# Patient Record
Sex: Female | Born: 1980 | Smoking: Never smoker
Health system: Southern US, Community
[De-identification: ages and names within clinical notes are randomized; demographics above are authoritative.]

## PROBLEM LIST (undated history)

## (undated) ENCOUNTER — Inpatient Hospital Stay (HOSPITAL_COMMUNITY): Payer: Self-pay

## (undated) DIAGNOSIS — F419 Anxiety disorder, unspecified: Secondary | ICD-10-CM

## (undated) DIAGNOSIS — O24419 Gestational diabetes mellitus in pregnancy, unspecified control: Secondary | ICD-10-CM

## (undated) DIAGNOSIS — F32A Depression, unspecified: Secondary | ICD-10-CM

## (undated) DIAGNOSIS — K8020 Calculus of gallbladder without cholecystitis without obstruction: Secondary | ICD-10-CM

## (undated) DIAGNOSIS — K859 Acute pancreatitis without necrosis or infection, unspecified: Secondary | ICD-10-CM

## (undated) DIAGNOSIS — F329 Major depressive disorder, single episode, unspecified: Secondary | ICD-10-CM

## (undated) HISTORY — PX: NO PAST SURGERIES: SHX2092

## (undated) HISTORY — DX: Gestational diabetes mellitus in pregnancy, unspecified control: O24.419

---

## 2011-06-08 ENCOUNTER — Other Ambulatory Visit (HOSPITAL_COMMUNITY)
Admission: RE | Admit: 2011-06-08 | Discharge: 2011-06-08 | Disposition: A | Discharge status: Home / Self Care | Payer: No Typology Code available for payment source | Source: Ambulatory Visit | Attending: Obstetrics and Gynecology | Admitting: Obstetrics and Gynecology

## 2011-06-08 DIAGNOSIS — Z113 Encounter for screening for infections with a predominantly sexual mode of transmission: Secondary | ICD-10-CM | POA: Insufficient documentation

## 2011-06-08 DIAGNOSIS — Z124 Encounter for screening for malignant neoplasm of cervix: Secondary | ICD-10-CM | POA: Insufficient documentation

## 2011-06-08 LAB — OB RESULTS CONSOLE GC/CHLAMYDIA
Chlamydia: NEGATIVE
Gonorrhea: NEGATIVE

## 2011-06-10 DIAGNOSIS — K859 Acute pancreatitis without necrosis or infection, unspecified: Secondary | ICD-10-CM

## 2011-06-10 DIAGNOSIS — K8020 Calculus of gallbladder without cholecystitis without obstruction: Secondary | ICD-10-CM

## 2011-06-10 HISTORY — DX: Acute pancreatitis without necrosis or infection, unspecified: K85.90

## 2011-06-10 HISTORY — DX: Calculus of gallbladder without cholecystitis without obstruction: K80.20

## 2011-07-01 ENCOUNTER — Inpatient Hospital Stay (HOSPITAL_COMMUNITY)
Admission: AD | Admit: 2011-07-01 | Discharge: 2011-07-10 | DRG: 781 | Disposition: A | Discharge status: Home / Self Care | Payer: No Typology Code available for payment source | Source: Ambulatory Visit | Attending: Obstetrics and Gynecology | Admitting: Obstetrics and Gynecology

## 2011-07-01 ENCOUNTER — Observation Stay (HOSPITAL_COMMUNITY): Payer: No Typology Code available for payment source

## 2011-07-01 ENCOUNTER — Encounter (HOSPITAL_COMMUNITY): Payer: Self-pay | Admitting: *Deleted

## 2011-07-01 DIAGNOSIS — O9989 Other specified diseases and conditions complicating pregnancy, childbirth and the puerperium: Principal | ICD-10-CM | POA: Diagnosis present

## 2011-07-01 DIAGNOSIS — K831 Obstruction of bile duct: Secondary | ICD-10-CM

## 2011-07-01 DIAGNOSIS — K8020 Calculus of gallbladder without cholecystitis without obstruction: Secondary | ICD-10-CM | POA: Diagnosis present

## 2011-07-01 DIAGNOSIS — K859 Acute pancreatitis without necrosis or infection, unspecified: Secondary | ICD-10-CM | POA: Diagnosis present

## 2011-07-01 DIAGNOSIS — O210 Mild hyperemesis gravidarum: Secondary | ICD-10-CM | POA: Diagnosis present

## 2011-07-01 DIAGNOSIS — R109 Unspecified abdominal pain: Secondary | ICD-10-CM | POA: Diagnosis present

## 2011-07-01 HISTORY — DX: Anxiety disorder, unspecified: F41.9

## 2011-07-01 LAB — CBC
Hemoglobin: 11.4 g/dL — ABNORMAL LOW (ref 12.0–15.0)
MCH: 28.6 pg (ref 26.0–34.0)
MCV: 83.5 fL (ref 78.0–100.0)
Platelets: 152 10*3/uL (ref 150–400)
RBC: 3.99 MIL/uL (ref 3.87–5.11)
WBC: 9.5 10*3/uL (ref 4.0–10.5)

## 2011-07-01 LAB — HCG, QUANTITATIVE, PREGNANCY: hCG, Beta Chain, Quant, S: 86275 m[IU]/mL — ABNORMAL HIGH (ref <5)

## 2011-07-01 LAB — URINALYSIS, ROUTINE W REFLEX MICROSCOPIC
Glucose, UA: NEGATIVE mg/dL
Hgb urine dipstick: NEGATIVE
Protein, ur: NEGATIVE mg/dL
Specific Gravity, Urine: 1.01 (ref 1.005–1.030)
pH: 6 (ref 5.0–8.0)

## 2011-07-01 LAB — DIFFERENTIAL
Lymphs Abs: 1.1 10*3/uL (ref 0.7–4.0)
Monocytes Relative: 5 % (ref 3–12)
Neutro Abs: 7.9 10*3/uL — ABNORMAL HIGH (ref 1.7–7.7)
Neutrophils Relative %: 83 % — ABNORMAL HIGH (ref 43–77)

## 2011-07-01 LAB — LIPASE, BLOOD: Lipase: 105 U/L — ABNORMAL HIGH (ref 11–59)

## 2011-07-01 LAB — COMPREHENSIVE METABOLIC PANEL
AST: 19 U/L (ref 0–37)
Albumin: 3.6 g/dL (ref 3.5–5.2)
BUN: 6 mg/dL (ref 6–23)
Calcium: 9.1 mg/dL (ref 8.4–10.5)
Creatinine, Ser: 0.42 mg/dL — ABNORMAL LOW (ref 0.50–1.10)
Total Protein: 6.2 g/dL (ref 6.0–8.3)

## 2011-07-01 LAB — AMYLASE: Amylase: 151 U/L — ABNORMAL HIGH (ref 0–105)

## 2011-07-01 MED ORDER — ZOLPIDEM TARTRATE 5 MG PO TABS: 5.0000 mg | ORAL_TABLET | Freq: Every evening | ORAL | Status: DC | PRN

## 2011-07-01 MED ORDER — SODIUM CHLORIDE 0.9 % IV SOLN
1000.0000 mL | Freq: Once | INTRAVENOUS | Status: AC
  Administered 2011-07-01: 1000 mL via INTRAVENOUS

## 2011-07-01 MED ORDER — PANTOPRAZOLE SODIUM 40 MG IV SOLR
40.0000 mg | INTRAVENOUS | Status: DC
  Administered 2011-07-01 – 2011-07-10 (×9): 40 mg via INTRAVENOUS
  Filled 2011-07-01 (×9): qty 40

## 2011-07-01 MED ORDER — SODIUM CHLORIDE 0.9 % IV SOLN
1000.0000 mL | INTRAVENOUS | Status: DC
  Administered 2011-07-01 – 2011-07-02 (×2): 1000 mL via INTRAVENOUS

## 2011-07-01 MED ORDER — SODIUM CHLORIDE 0.9 % IV SOLN: 1000.0000 mL | Freq: Once | INTRAVENOUS | Status: DC

## 2011-07-01 MED ORDER — M.V.I. ADULT IV INJ
INJECTION | Freq: Once | INTRAVENOUS | Status: AC
  Administered 2011-07-01: 20:00:00 via INTRAVENOUS
  Filled 2011-07-01: qty 1000

## 2011-07-01 MED ORDER — ACETAMINOPHEN 650 MG RE SUPP
650.0000 mg | RECTAL | Status: DC | PRN
  Administered 2011-07-01: 650 mg via RECTAL
  Filled 2011-07-01: qty 1

## 2011-07-01 MED ORDER — FAMOTIDINE IN NACL 20-0.9 MG/50ML-% IV SOLN
20.0000 mg | Freq: Two times a day (BID) | INTRAVENOUS | Status: DC
  Administered 2011-07-01 – 2011-07-10 (×17): 20 mg via INTRAVENOUS
  Filled 2011-07-01 (×18): qty 50

## 2011-07-01 MED ORDER — M.V.I. ADULT IV INJ: 10.0000 mL | Freq: Once | INTRAVENOUS | Status: DC

## 2011-07-01 MED ORDER — BUTORPHANOL TARTRATE 2 MG/ML IJ SOLN
2.0000 mg | INTRAMUSCULAR | Status: DC | PRN
  Administered 2011-07-01 – 2011-07-04 (×8): 2 mg via INTRAVENOUS
  Filled 2011-07-01 (×8): qty 1

## 2011-07-01 MED ORDER — ONDANSETRON HCL 4 MG/2ML IJ SOLN
4.0000 mg | Freq: Four times a day (QID) | INTRAMUSCULAR | Status: DC | PRN
  Administered 2011-07-01: 4 mg via INTRAVENOUS
  Filled 2011-07-01: qty 2

## 2011-07-01 NOTE — Progress Notes (Signed)
Patient ID: Shelby Paul, female   DOB: [birth year], 31 y.o.   MRN: [id number] General surgery consult: I spoke with Dr. Cathlean Cower with regard to a Gen. surgery consult and that he would stop by the morning and that if surgery is necessary at would best be performed at the pancreatitis has resolved.  He recommended IV hydration and bowel rest.  He agreed to come see the patient tomorrow.  I agreed there are no acute issues that need to be addressed immediately.  Maternal fetal medicine consult: I spoke with Dr. Aldine Contes with regard to a maternal fetal medicine consult and he felt that we would certainly depend mostly on the expertise the general surgeon with regard to management.  He did mention the fact that we could potentially have the possibility of a GI consult if her pancreatitis were to worsen.  He agreed to come see the patient tomorrow. I agreed there are no acute issues the need to be addressed immediately.  I appreciate input of the consult.

## 2011-07-01 NOTE — H&P (Signed)
History and Physical  ZO:[id number] pain  HPI:  Shelby Paul is an 31 y.o. female.  Presented to the office today with a one-week history of hyperemesis.  She stated that she was having emesis at least twice per day for the last several weeks.  However this had increased to more pain starting one week ago.  She triad proton pump inhibitor, vitamin B6, and Zofran without much success.  The illness progressed to where she had 6 episodes of emesis in the last 48 hours.  The pain has increased as well.  The pain is midepigastric and his like an acid burn.  She stated that she only took the proton pump inhibitor yesterday for the first time.   ROS:  Review of Systems  Constitutional: Positive for malaise/fatigue. Negative for fever, chills, weight loss and diaphoresis.  HENT: Negative for hearing loss, ear pain, nosebleeds, congestion, sore throat and neck pain.   Eyes: Negative for blurred vision and discharge.  Respiratory: Negative for cough, sputum production, shortness of breath and wheezing.   Cardiovascular: Negative for chest pain, palpitations, orthopnea and leg swelling.  Gastrointestinal: Positive for heartburn, nausea, vomiting and abdominal pain. Negative for diarrhea, constipation, blood in stool and melena.  Genitourinary: Negative for dysuria, urgency, frequency, hematuria and flank pain.  Musculoskeletal: Negative for myalgias, back pain and joint pain.  Skin: Negative for itching and rash.  Neurological: Positive for weakness. Negative for dizziness, tingling, tremors, focal weakness, loss of consciousness and headaches.  Endo/Heme/Allergies: Positive for polydipsia.  Psychiatric/Behavioral: Negative for depression and suicidal ideas.    OB History: G4, P2-0-1-2  Pertinent Gynecological History: Contraception: none DES exposure: denies Blood transfusions: none Sexually transmitted diseases: past history: Chlamydia Previous GYN Procedures: None  Last mammogram: na Date:  na  Last pap: normal Date: 06/08/2011   Menstrual History: Menarche age:--- Patient's last menstrual period was 04/24/2011.   Menses: Not applicable Bleeding: Not applicable  PMH:   Past Medical History  Diagnosis Date  . Anxiety     PSH:  History reviewed. No pertinent past surgical history.  Medications:   Prescriptions prior to admission  Medication Sig Dispense Refill  . ondansetron (ZOFRAN-ODT) 8 MG disintegrating tablet Take 8 mg by mouth 2 (two) times daily as needed. For nausea      . pantoprazole (PROTONIX) 40 MG tablet Take 40 mg by mouth daily.      Marland Kitchen pyridOXINE (VITAMIN B-6) 100 MG tablet Take 100 mg by mouth daily.        Allergies:  No Known Allergies  Family Hx:  History reviewed. No pertinent family history.  Social History:   reports that she has never smoked. She has never used smokeless tobacco. She reports that she does not drink alcohol or use illicit drugs.  Blood pressure 117/79, pulse 50, temperature 98.1 F (36.7 C), temperature source Oral, resp. rate 22, height 5\' 4"  (1.626 m), weight 77.253 kg (170 lb 5 oz), last menstrual period 04/24/2011, SpO2 100.00%. Physical Exam  Vitals reviewed. Constitutional: She is oriented to person, place, and time. She appears well-developed and well-nourished. No distress.  HENT:  Head: Normocephalic and atraumatic.  Right Ear: External ear normal.  Left Ear: External ear normal.  Mouth/Throat: Oropharynx is clear and moist. No oropharyngeal exudate.  Eyes: Conjunctivae and EOM are normal. Pupils are equal, round, and reactive to light. Right eye exhibits no discharge. Left eye exhibits no discharge.  Neck: Normal range of motion. Neck supple. No thyromegaly present.  Cardiovascular: Normal rate,  regular rhythm, normal heart sounds and intact distal pulses.   No murmur heard. Pulmonary/Chest: Effort normal. No respiratory distress. She has no wheezes. She has no rales.  Abdominal: Soft. Bowel sounds are  normal. She exhibits no distension and no mass. There is no hepatosplenomegaly. There is tenderness in the epigastric area. There is no rigidity, no rebound, no guarding, no CVA tenderness, no tenderness at McBurney's point and negative Murphy's sign. No hernia.         There was no evidence of organomegaly or tenderness except for the subxiphoid region where she states there is some burning.  The rectus abdominis muscle is tender in the upper third.  Laterally and this does worsen with Valsalva.  The abdomen is certainly nonacute there was absolutely no tenderness below the umbilicus.  I do not think this is acute or an infectious process skeletal.  Musculoskeletal: Normal range of motion. She exhibits no edema and no tenderness.  Neurological: She is alert and oriented to person, place, and time. She has normal reflexes. She displays normal reflexes. She exhibits normal muscle tone.  Skin: Skin is warm and dry. No rash noted. She is not diaphoretic. No erythema.  Psychiatric: She has a normal mood and affect. Her behavior is normal. Judgment and thought content normal.    No results found for this or any previous visit (from the past 24 hour(s)).  No results found.  Assessment/Plan: Hyperemesis  IUP at 8 weeks Abdominal pain  Plan:  Placed in observation status Obtain ultrasound imaging of the upper abdomen to ensure there is no gallstone or inflammatory pathology. Bowel rest, anti-emetics, antacids  Meshell Abdulaziz H. 07/01/2011, 6:33 PM

## 2011-07-02 ENCOUNTER — Encounter (HOSPITAL_COMMUNITY): Payer: Self-pay | Admitting: *Deleted

## 2011-07-02 ENCOUNTER — Observation Stay (HOSPITAL_COMMUNITY): Payer: No Typology Code available for payment source

## 2011-07-02 DIAGNOSIS — K8020 Calculus of gallbladder without cholecystitis without obstruction: Secondary | ICD-10-CM

## 2011-07-02 DIAGNOSIS — K859 Acute pancreatitis without necrosis or infection, unspecified: Secondary | ICD-10-CM

## 2011-07-02 LAB — HEMOGLOBIN A1C
Hgb A1c MFr Bld: 5.5 % (ref <5.7)
Mean Plasma Glucose: 111 mg/dL (ref <117)

## 2011-07-02 LAB — TSH: TSH: 0.297 u[IU]/mL — ABNORMAL LOW (ref 0.350–4.500)

## 2011-07-02 MED ORDER — POTASSIUM CHLORIDE 2 MEQ/ML IV SOLN
INTRAVENOUS | Status: DC
  Administered 2011-07-02 – 2011-07-09 (×20): via INTRAVENOUS
  Filled 2011-07-02 (×27): qty 1000

## 2011-07-02 MED ORDER — KCL IN DEXTROSE-NACL 10-5-0.45 MEQ/L-%-% IV SOLN: INTRAVENOUS | Status: DC

## 2011-07-02 NOTE — Consult Note (Signed)
Reason for Consult:gallstone pancreatitis  Referring Physician: Filbert Berthold  Shelby Paul is an 31 y.o. female.  HPI: pleasant female presents with several day history of sharp epigastric abdominal pain, nausea and vomiting.   Pain is moderate to severe. She is [redacted] weeks pregnant.  She has had no other similar attacks.  BM's were normal. Otherwise, she had been doing well  Past Medical History  Diagnosis Date  . Anxiety     History reviewed. No pertinent past surgical history.  History reviewed. No pertinent family history.  Social History:  reports that she has never smoked. She has never used smokeless tobacco. She reports that she does not drink alcohol or use illicit drugs.  Allergies: No Known Allergies  Medications: I have reviewed the patient's current medications.  Results for orders placed during the hospital encounter of 07/01/11 (from the past 48 hour(s))  AMYLASE     Status: Abnormal   Collection Time   07/01/11  7:00 PM      Component Value Range Comment   Amylase 151 (*) 0 - 105 (U/L)   HEMOGLOBIN A1C     Status: Normal   Collection Time   07/01/11  7:00 PM      Component Value Range Comment   Hemoglobin A1C 5.5  <5.7 (%)    Mean Plasma Glucose 111  <117 (mg/dL)   TSH     Status: Abnormal   Collection Time   07/01/11  7:00 PM      Component Value Range Comment   TSH 0.297 (*) 0.350 - 4.500 (uIU/mL)   LIPASE, BLOOD     Status: Abnormal   Collection Time   07/01/11  7:00 PM      Component Value Range Comment   Lipase 105 (*) 11 - 59 (U/L)   COMPREHENSIVE METABOLIC PANEL     Status: Abnormal   Collection Time   07/01/11  7:00 PM      Component Value Range Comment   Sodium 130 (*) 135 - 145 (mEq/L)    Potassium 3.6  3.5 - 5.1 (mEq/L)    Chloride 96  96 - 112 (mEq/L)    CO2 22  19 - 32 (mEq/L)    Glucose, Bld 92  70 - 99 (mg/dL)    BUN 6  6 - 23 (mg/dL)    Creatinine, Ser 3.32 (*) 0.50 - 1.10 (mg/dL)    Calcium 9.1  8.4 - 10.5 (mg/dL)    Total Protein 6.2   6.0 - 8.3 (g/dL)    Albumin 3.6  3.5 - 5.2 (g/dL)    AST 19  0 - 37 (U/L)    ALT 31  0 - 35 (U/L)    Alkaline Phosphatase 42  39 - 117 (U/L)    Total Bilirubin 0.5  0.3 - 1.2 (mg/dL)    GFR calc non Af Amer >90  >90 (mL/min)    GFR calc Af Amer >90  >90 (mL/min)   HCG, QUANTITATIVE, PREGNANCY     Status: Abnormal   Collection Time   07/01/11  7:00 PM      Component Value Range Comment   hCG, Beta Chain, Quant, S 95188 (*) <5 (mIU/mL)   DIFFERENTIAL     Status: Abnormal   Collection Time   07/01/11  7:00 PM      Component Value Range Comment   Neutrophils Relative 83 (*) 43 - 77 (%)    Neutro Abs 7.9 (*) 1.7 - 7.7 (K/uL)    Lymphocytes  Relative 12  12 - 46 (%)    Lymphs Abs 1.1  0.7 - 4.0 (K/uL)    Monocytes Relative 5  3 - 12 (%)    Monocytes Absolute 0.5  0.1 - 1.0 (K/uL)    Eosinophils Relative 0  0 - 5 (%)    Eosinophils Absolute 0.0  0.0 - 0.7 (K/uL)    Basophils Relative 0  0 - 1 (%)    Basophils Absolute 0.0  0.0 - 0.1 (K/uL)   CBC     Status: Abnormal   Collection Time   07/01/11  7:00 PM      Component Value Range Comment   WBC 9.5  4.0 - 10.5 (K/uL)    RBC 3.99  3.87 - 5.11 (MIL/uL)    Hemoglobin 11.4 (*) 12.0 - 15.0 (g/dL)    HCT 01.0 (*) 27.2 - 46.0 (%)    MCV 83.5  78.0 - 100.0 (fL)    MCH 28.6  26.0 - 34.0 (pg)    MCHC 34.2  30.0 - 36.0 (g/dL)    RDW 53.6  64.4 - 03.4 (%)    Platelets 152  150 - 400 (K/uL)   URINALYSIS, ROUTINE W REFLEX MICROSCOPIC     Status: Abnormal   Collection Time   07/01/11 11:02 PM      Component Value Range Comment   Color, Urine YELLOW  YELLOW     APPearance CLEAR  CLEAR     Specific Gravity, Urine 1.010  1.005 - 1.030     pH 6.0  5.0 - 8.0     Glucose, UA NEGATIVE  NEGATIVE (mg/dL)    Hgb urine dipstick NEGATIVE  NEGATIVE     Bilirubin Urine NEGATIVE  NEGATIVE     Ketones, ur 40 (*) NEGATIVE (mg/dL)    Protein, ur NEGATIVE  NEGATIVE (mg/dL)    Urobilinogen, UA 0.2  0.0 - 1.0 (mg/dL)    Nitrite NEGATIVE  NEGATIVE      Leukocytes, UA NEGATIVE  NEGATIVE  MICROSCOPIC NOT DONE ON URINES WITH NEGATIVE PROTEIN, BLOOD, LEUKOCYTES, NITRITE, OR GLUCOSE <1000 mg/dL.  URINE RAPID DRUG SCREEN (HOSP PERFORMED)     Status: Normal   Collection Time   07/01/11 11:02 PM      Component Value Range Comment   Opiates NONE DETECTED  NONE DETECTED     Cocaine NONE DETECTED  NONE DETECTED     Benzodiazepines NONE DETECTED  NONE DETECTED     Amphetamines NONE DETECTED  NONE DETECTED     Tetrahydrocannabinol NONE DETECTED  NONE DETECTED     Barbiturates NONE DETECTED  NONE DETECTED      US Abdomen Complete  07/01/2011  *RADIOLOGY REPORT*  Clinical Data:  Abdominal pain.  Early pregnancy.  COMPLETE ABDOMINAL ULTRASOUND  Comparison:  None.  Findings:  Gallbladder:  Several shadowing gallstones are noted on the in the gallbladder.  One measures 1.7 cm in long axis and the other in the gallbladder neck measures 1.8 cm in long axis.  The gallbladder wall measures 4 mm in thickness, which is abnormal.  Sonographic Murphy's sign is present.  No pericholecystic fluid.  Common bile duct:  Measures 4 mm in diameter, within normal limits, without directly visualized choledocholithiasis.  Liver:  No focal lesion identified.  Within normal limits in parenchymal echogenicity.  IVC:  Appears normal.  Pancreas:  No focal abnormality seen.  Spleen:  Measures measures 6.8 cm craniocaudad and appears normal.  Right Kidney:  Measures 10.8 cm in length  and appears normal.  Left Kidney:  Measures 10.7 cm ninth and appears normal.  Abdominal aorta:  No aneurysm identified.  IMPRESSION:  1.  Cholelithiasis, including a including a 1.8 cm stone in the neck of the gallbladder, with gallbladder wall thickening. Sonographic Murphy's sign is present.  Correlate clinically in assessing for acute cholecystitis.  Original Report Authenticated By: Dellia Cloud, M.D.   US Ob Comp Less 14 Wks  07/01/2011  *RADIOLOGY REPORT*  Clinical Data: Epigastric abdominal  pain. Hyperemesis.  Estimated gestational age [redacted] weeks 5 days by LMP of 04/24/2011.  Quantitative beta HCG 86,275.  OBSTETRIC <14 WK ULTRASOUND  Technique:  Transabdominal ultrasound was performed for evaluation of the gestation as well as the maternal uterus and adnexal regions.  Comparison:  None.  Intrauterine gestational sac: Single, normal in shape. Yolk sac: Visualized. Embryo: Visualized. Cardiac Activity: Visualized. Heart Rate: 170 bpm  CRL:  19.1 mm  8w  4d         [place] EDC: 02/05/2029  Maternal uterus/Adnexae: No subchorionic hemorrhage.  Normal-appearing right ovary measuring approximately 3.5 x 2.1 x 2.2 cm, containing small follicular cysts.  Normal-appearing left ovary measuring approximately 2.6 x 1.2 x 1.7 cm, containing small follicular cyst.  No adnexal masses or free pelvic fluid.  IMPRESSION:  1.  Single live intrauterine embryo with estimated gestational age [redacted] weeks 4 days by crown-rump length, slightly less than the estimated gestational age by LMP of 9 weeks 5 days.  Ultrasound [facility] 02/06/2012. 2.  No subchorionic hemorrhage. 3.  Normal-appearing ovaries.  No adnexal masses or free fluid.  Original Report Authenticated By: Arnell Sieving, M.D.    Review of Systems  All other systems reviewed and are negative.   Blood pressure 109/67, pulse 62, temperature 98.2 F (36.8 C), temperature source Oral, resp. rate 18, height 5\' 4"  (1.626 m), weight 170 lb 5 oz (77.253 kg), last menstrual period 04/24/2011, SpO2 96.00%. Physical Exam  Constitutional: She is oriented to person, place, and time. She appears well-developed and well-nourished. No distress.       Appears fairly comfortable  HENT:  Head: Normocephalic and atraumatic.  Right Ear: External ear normal.  Left Ear: External ear normal.  Nose: Nose normal.  Mouth/Throat: Oropharynx is clear and moist. No oropharyngeal exudate.  Eyes: Conjunctivae are normal. Pupils are equal, round, and reactive to light. No scleral icterus.    Neck: Normal range of motion. No tracheal deviation present. No thyromegaly present.  Cardiovascular: Normal rate, regular rhythm, normal heart sounds and intact distal pulses.   No murmur heard. Respiratory: Effort normal and breath sounds normal. No respiratory distress. She has no wheezes.  GI: Soft. She exhibits no distension. There is tenderness. There is guarding.       She has moderate tenderness with guarding in the epigastrium  Musculoskeletal: Normal range of motion. She exhibits no edema and no tenderness.  Lymphadenopathy:    She has no cervical adenopathy.  Neurological: She is alert and oriented to person, place, and time.  Skin: Skin is warm and dry. No rash noted. She is not diaphoretic. No erythema.  Psychiatric: Her behavior is normal. Judgment normal.    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  We would like her pancreatitis to improve prior to lap chole.  Given her LFT's, I doubt a CBD stone.  We will follow her closely  and proceed with Lap Chole when appropriate.  Will continue bowel rest and IV rehydration  Kymberli Wiegand A 07/02/2011, 5:53 AM

## 2011-07-02 NOTE — Progress Notes (Signed)
Maternal Fetal Medicine Note  Ms. Siciliano is a 31 yo [id number], LMP 04/24/2011 at 8 5/7 weeks by early ultrasound currently admitted due to hyperemesis and biliary colic.  She reports worsening nausea and vomiting despite Zofran, B6 and PPIs.  RUQ ultrasoudn performed on admission revealed Cholelithiasis, including a 1.8 cm stone in the neck of the gallbladder with wall thickening.  She denies fever/ chills and her admission WBC count was 9.5.  The patient was noted to have an elevated Lipase and Amylase (105 and 151 respectively) consistent with gall stone pancreatitis.  She is currently NPO with plans to Lap Chole once her pancreatitis improves.  PMH- anxiety  PSH- neg  Meds - Zofran ODT             Protoix             Vitamin B6  Social:  Non drinker, Non smoker, denies drug use.  Immigrant from [place].  POB hx: term SVD x 2 without complications, early SAB  Impression/ Plan: 1) Hyperemesis / Probable Biliary Pancreatitis (mild) - concur with management. Surgical intervention is associated with better maternal and fetal outcomes compared to conservative management.  Cholecystectomy in patients with mild disease that resolves is appropriate to prevent recurrence and would not anticipate any surgical challenges at this early gestational age.  If the patient's pancreatitis does not rapidly improve or worsen, would consider GI consult for possible ERCP.  Would anticipate improvement in hyperemesis after procedure.  The patient's TSH was somewhat suppressed - recommend that this be repeated in the 2nd trimester.  Thank you for the opportunity to be a part of the care of Ms. Hijazi.  We will continue to follow her with ultrasounds in our [facility]. Please contact our office if we can be of further assistance.   I spent approximately 30 minutes with this patient with over 50% of time spent in face-to-face counseling.  Alpha Gula, MD

## 2011-07-02 NOTE — Progress Notes (Signed)
UR Chart review completed.  

## 2011-07-02 NOTE — Progress Notes (Signed)
Patient ID: Shelby Paul, female   DOB: November 28, 1980, 31 y.o.   MRN: [id number] Hospital Day: 2  S: : state the pain is controlled but not all of the way resolved and states she is not with emesis since observation started  O: Blood pressure 108/74, pulse 65, temperature 98.5 F (36.9 C), temperature source Oral, resp. rate 18, height 5\' 4"  (1.626 m), weight 77.253 kg (170 lb 5 oz), last menstrual period 04/24/2011, SpO2 98.00%.   FHT:na Toco: na SVE: na  A/P- 31 y.o. admitted with: Hyperemesis gravidarum and placed on observation.  Further evaluation revealed the patient to have symptomatic cholelithiasis with pancreatitis by elevated amylase and lipase.  General surgery and maternal fetal medicine have been consulted.    Plan the current plan will be to maintain hydration, control the pain, and a wait for resolution of the pancreatitis.  At that point general surgery may proceed to the operating room  Patient Active Hospital Problem List: No active hospital problems.

## 2011-07-03 LAB — COMPREHENSIVE METABOLIC PANEL
ALT: 23 U/L (ref 0–35)
Alkaline Phosphatase: 43 U/L (ref 39–117)
CO2: 24 mEq/L (ref 19–32)
GFR calc Af Amer: >90 mL/min (ref >90)
GFR calc non Af Amer: >90 mL/min (ref >90)
Glucose, Bld: 118 mg/dL — ABNORMAL HIGH (ref 70–99)
Potassium: 3.6 mEq/L (ref 3.5–5.1)
Sodium: 135 mEq/L (ref 135–145)
Total Protein: 6 g/dL (ref 6.0–8.3)

## 2011-07-03 LAB — CBC
Hemoglobin: 11.4 g/dL — ABNORMAL LOW (ref 12.0–15.0)
RBC: 3.95 MIL/uL (ref 3.87–5.11)
WBC: 9.1 10*3/uL (ref 4.0–10.5)

## 2011-07-03 LAB — AMYLASE: Amylase: 181 U/L — ABNORMAL HIGH (ref 0–105)

## 2011-07-03 LAB — LIPASE, BLOOD: Lipase: 117 U/L — ABNORMAL HIGH (ref 11–59)

## 2011-07-03 NOTE — Progress Notes (Signed)
UR Chart review completed.  

## 2011-07-03 NOTE — Progress Notes (Signed)
Update:  The patient has apparently developed some appetite and would like to try some by mouth intake.  The PA for the general surgery group stopped by and approved a clear liquid diet.  I have agreed other expertise with regard to this issue.   With regard to the nutrition status of the patient the maternal fetal medicine telephonic consultation with Dr. Carney Bern was performed and it is agreed upon that we will continue with D5 half-normal saline with 10 meq of KCl/ liter and that this can be continued for approximately 7 more days and then reassess her nutrition status at that time.  Hopefully her pancreatitis will resolve before then i and she can have her surgery and be on the mend at that time.

## 2011-07-03 NOTE — Progress Notes (Signed)
Patient ID: Shelby Paul, female   DOB: February 03, 1981, 31 y.o.   MRN: [id number] Hospital Day: 3  S: Still NPO and not hungry but is thirsty and states the pain is still present with no new c/o Vaginal bleeding or contractions And denies emesis in the last 24 hours and the no BM and no flatus and voiding and ambulating and is up every 2-3 hours and this is encouraged  O: Blood pressure 102/63, pulse 65, temperature 99 F (37.2 C), temperature source Oral, resp. rate 16, height 5\' 4"  (1.626 m), weight 77.253 kg (170 lb 5 oz), last menstrual period 04/24/2011, SpO2 100.00%.   SVE: deferrred  Appears well and nontoxic Tenderness in the midepigastrium and the rest of the abdomen non acute and nontender Extremities nontender  A/P- 31 y.o. admitted with: Hyperemesis most likely secondary to gallstone pancreatitis Gallstone pancreatitis and amylase - lipase  Plan continue bowel rest and iv hydration And will need to consider nutrition And possible GI consult but agree with plan for surgery  And with surgery slight increase risk of miscarriage    Patient Active Hospital Problem List: No active hospital problems.

## 2011-07-03 NOTE — Progress Notes (Signed)
Subjective: Still tender, complains of pain and not eating.  Objective: Vital signs in last 24 hours: Temp:  [98.6 F (37 C)-99.2 F (37.3 C)] 99 F (37.2 C) (05/24 1227) Pulse Rate:  [62-71] 62  (05/24 1227) Resp:  [16-18] 16  (05/24 1227) BP: (93-109)/(63-70) 93/63 mmHg (05/24 1227) SpO2:  [98 %-100 %] 99 % (05/24 1227) Last BM Date: 06/30/11  Low grade temp, 99, Vss, lipase up some  Intake/Output from previous day: 05/23 0701 - 05/24 0700 In: 2438 [I.V.:2328; IV Piggyback:110] Out: 4900 [Urine:4900] Intake/Output this shift: Total I/O In: 456.3 [I.V.:456.3] Out: 600 [Urine:600]  General appearance: alert, cooperative, no distress and English is limited. GI: soft but still tender.  +BS, +BM  Lab Results:   Basename 07/03/11 0530 07/01/11 1900  WBC 9.1 9.5  HGB 11.4* 11.4*  HCT 33.5* 33.3*  PLT 151 152    BMET  Basename 07/03/11 0530 07/01/11 1900  NA 135 130*  K 3.6 3.6  CL 103 96  CO2 24 22  GLUCOSE 118* 92  BUN 4* 6  CREATININE 0.46* 0.42*  CALCIUM 8.5 9.1   PT/INR No results found for this basename: LABPROT:2,INR:2 in the last 72 hours   Lab 07/03/11 0530 07/01/11 1900  AST 13 19  ALT 23 31  ALKPHOS 43 42  BILITOT 0.8 0.5  PROT 6.0 6.2  ALBUMIN 3.1* 3.6     Lipase     Component Value Date/Time   LIPASE 117* 07/03/2011 0530  lipase 105 yesterday.   Studies/Results: US Abdomen Complete  07/01/2011  *RADIOLOGY REPORT*  Clinical Data:  Abdominal pain.  Early pregnancy.  COMPLETE ABDOMINAL ULTRASOUND  Comparison:  None.  Findings:  Gallbladder:  Several shadowing gallstones are noted on the in the gallbladder.  One measures 1.7 cm in long axis and the other in the gallbladder neck measures 1.8 cm in long axis.  The gallbladder wall measures 4 mm in thickness, which is abnormal.  Sonographic Murphy's sign is present.  No pericholecystic fluid.  Common bile duct:  Measures 4 mm in diameter, within normal limits, without directly visualized  choledocholithiasis.  Liver:  No focal lesion identified.  Within normal limits in parenchymal echogenicity.  IVC:  Appears normal.  Pancreas:  No focal abnormality seen.  Spleen:  Measures measures 6.8 cm craniocaudad and appears normal.  Right Kidney:  Measures 10.8 cm in length and appears normal.  Left Kidney:  Measures 10.7 cm ninth and appears normal.  Abdominal aorta:  No aneurysm identified.  IMPRESSION:  1.  Cholelithiasis, including a including a 1.8 cm stone in the neck of the gallbladder, with gallbladder wall thickening. Sonographic Murphy's sign is present.  Correlate clinically in assessing for acute cholecystitis.  Original Report Authenticated By: Dellia Cloud, M.D.   US Ob Comp Less 14 Wks  07/01/2011  *RADIOLOGY REPORT*  Clinical Data: Epigastric abdominal pain. Hyperemesis.  Estimated gestational age [redacted] weeks 5 days by LMP of 04/24/2011.  Quantitative beta HCG 86,275.  OBSTETRIC <14 WK ULTRASOUND  Technique:  Transabdominal ultrasound was performed for evaluation of the gestation as well as the maternal uterus and adnexal regions.  Comparison:  None.  Intrauterine gestational sac: Single, normal in shape. Yolk sac: Visualized. Embryo: Visualized. Cardiac Activity: Visualized. Heart Rate: 170 bpm  CRL:  19.1 mm  8w  4d         [place] EDC: 02/05/2029  Maternal uterus/Adnexae: No subchorionic hemorrhage.  Normal-appearing right ovary measuring approximately 3.5 x 2.1 x 2.2 cm,  containing small follicular cysts.  Normal-appearing left ovary measuring approximately 2.6 x 1.2 x 1.7 cm, containing small follicular cyst.  No adnexal masses or free pelvic fluid.  IMPRESSION:  1.  Single live intrauterine embryo with estimated gestational age [redacted] weeks 4 days by crown-rump length, slightly less than the estimated gestational age by LMP of 9 weeks 5 days.  Ultrasound [facility] 02/06/2012. 2.  No subchorionic hemorrhage. 3.  Normal-appearing ovaries.  No adnexal masses or free fluid.  Original Report  Authenticated By: Arnell Sieving, M.D.    Medications:    . sodium chloride  1,000 mL Intravenous Once  . famotidine (PEPCID) IV  20 mg Intravenous Q12H  . pantoprazole (PROTONIX) IV  40 mg Intravenous Q24H    Assessment/Plan Gallstone Pancreatitis with impacted gallstone in neck of gallbladder [redacted] weeks pregnant    PLAN:  You can try some sips she wants to eat would go slow and repeat labs in AM.  LOS: 2 days    Shelby Paul 07/03/2011

## 2011-07-03 NOTE — Progress Notes (Signed)
CLEARS FOR NOW.  Once pain better can advance to low fat diet.  Will need lap chole second trimester.

## 2011-07-04 LAB — CBC
Hemoglobin: 11.3 g/dL — ABNORMAL LOW (ref 12.0–15.0)
MCH: 28.7 pg (ref 26.0–34.0)
RBC: 3.94 MIL/uL (ref 3.87–5.11)
WBC: 7.1 10*3/uL (ref 4.0–10.5)

## 2011-07-04 LAB — COMPREHENSIVE METABOLIC PANEL
AST: 11 U/L (ref 0–37)
CO2: 26 mEq/L (ref 19–32)
Calcium: 8.6 mg/dL (ref 8.4–10.5)
Creatinine, Ser: 0.56 mg/dL (ref 0.50–1.10)
GFR calc non Af Amer: >90 mL/min (ref >90)

## 2011-07-04 LAB — LIPASE, BLOOD: Lipase: 162 U/L — ABNORMAL HIGH (ref 11–59)

## 2011-07-04 MED ORDER — PIPERACILLIN-TAZOBACTAM 3.375 G IVPB 30 MIN
3.3750 g | Freq: Four times a day (QID) | INTRAVENOUS | Status: DC
  Administered 2011-07-04 – 2011-07-09 (×18): 3.375 g via INTRAVENOUS
  Filled 2011-07-04 (×20): qty 50

## 2011-07-04 NOTE — Plan of Care (Signed)
Problem: Phase II Progression Outcomes Goal: Tolerating diet Outcome: Not Met (add Reason) nauseated

## 2011-07-04 NOTE — Progress Notes (Signed)
  Subjective: Feels the same as previous. No nausea  Objective: Vital signs in last 24 hours: Temp:  [98.1 F (36.7 C)-98.7 F (37.1 C)] 98.1 F (36.7 C) (05/25 1200) Pulse Rate:  [63-77] 63  (05/25 1200) Resp:  [16-18] 16  (05/25 1200) BP: (94-121)/(60-72) 100/67 mmHg (05/25 1200) SpO2:  [98 %-100 %] 100 % (05/25 1200) Last BM Date: 06/30/11  Intake/Output from previous day: 05/24 0701 - 05/25 0700 In: 1711.3 [P.O.:380; I.V.:1331.3] Out: 2800 [Urine:2800] Intake/Output this shift: Total I/O In: 765 [P.O.:240; I.V.:475; IV Piggyback:50] Out: 700 [Urine:700]  General appearance: alert, cooperative and no distress GI: soft, moderate epigastric and LUQ pain which greatest in the RUQ, ND, no peritoneal signs  Lab Results:   Basename 07/04/11 0519 07/03/11 0530  WBC 7.1 9.1  HGB 11.3* 11.4*  HCT 33.7* 33.5*  PLT 151 151   BMET  Basename 07/04/11 0519 07/03/11 0530  NA 135 135  K 3.5 3.6  CL 102 103  CO2 26 24  GLUCOSE 98 118*  BUN 4* 4*  CREATININE 0.56 0.46*  CALCIUM 8.6 8.5   PT/INR No results found for this basename: LABPROT:2,INR:2 in the last 72 hours ABG No results found for this basename: PHART:2,PCO2:2,PO2:2,HCO3:2 in the last 72 hours  Studies/Results: No results found.  Anti-infectives: Anti-infectives    None      Assessment/Plan: s/p * No surgery found * given her US findings of a large stone in the neck of the GB and wall thickening,  and focal RUQ tenderness I think that this may represent acute cholecystitis. I would recommend continued NPO status as well as starting antibiotic coverage for cholecystitis.  She will eventually need cholecystectomy but later in pregnancy is preferred.  LOS: 3 days    Lodema Pilot DAVID 07/04/2011

## 2011-07-04 NOTE — Progress Notes (Signed)
Subjective: Patient reports no problems voiding.  She denies nausea and feels hungry however she has epigastric pain when she has sips of clears.   Objective: I have reviewed patient's vital signs, intake and output and labs.  General: alert and cooperative GI: abnormal findings:  abdomen is soft. she has epigastric and RUQ  tenderness to mild palpation. + BS  LIPASE increased to 162 today   Assessment/Plan: HD 3 with pancreatitis / cholelithiasis  General surgery consult not reviewed.  Will make pt NPO and start IV abx given general surgery suggestion      LOS: 3 days    Shelby Paul J. 07/04/2011, 4:39 PM

## 2011-07-05 LAB — COMPREHENSIVE METABOLIC PANEL
ALT: 15 U/L (ref 0–35)
AST: 9 U/L (ref 0–37)
Alkaline Phosphatase: 48 U/L (ref 39–117)
CO2: 24 mEq/L (ref 19–32)
Chloride: 101 mEq/L (ref 96–112)
GFR calc non Af Amer: >90 mL/min (ref >90)
Potassium: 3.5 mEq/L (ref 3.5–5.1)
Sodium: 133 mEq/L — ABNORMAL LOW (ref 135–145)
Total Bilirubin: 0.6 mg/dL (ref 0.3–1.2)

## 2011-07-05 LAB — CBC
MCH: 28.7 pg (ref 26.0–34.0)
MCHC: 34.1 g/dL (ref 30.0–36.0)
MCV: 84.2 fL (ref 78.0–100.0)
Platelets: 149 10*3/uL — ABNORMAL LOW (ref 150–400)

## 2011-07-05 LAB — LIPASE, BLOOD: Lipase: 167 U/L — ABNORMAL HIGH (ref 11–59)

## 2011-07-05 NOTE — Progress Notes (Signed)
  Subjective: Says that the pain is better today  Objective: Vital signs in last 24 hours: Temp:  [97.4 F (36.3 C)-99.7 F (37.6 C)] 97.4 F (36.3 C) (05/26 0623) Pulse Rate:  [60-71] 62  (05/26 0623) Resp:  [16] 16  (05/26 0623) BP: (99-117)/(66-82) 99/66 mmHg (05/26 0623) SpO2:  [98 %-100 %] 98 % (05/26 0623) Last BM Date: 06/30/11  Intake/Output from previous day: 05/25 0701 - 05/26 0700 In: 1515 [P.O.:240; I.V.:1225; IV Piggyback:50] Out: 2050 [Urine:2050] Intake/Output this shift:    General appearance: alert, cooperative and no distress GI: soft, moderate tenderness in RUQ and epigastrium, LUQ pain better, ND  Lab Results:   [facility] 07/05/11 0527 07/04/11 0519  WBC 5.8 7.1  HGB 10.9* 11.3*  HCT 32.0* 33.7*  PLT 149* 151   BMET  Basename 07/05/11 0527 07/04/11 0519  NA 133* 135  K 3.5 3.5  CL 101 102  CO2 24 26  GLUCOSE 102* 98  BUN 4* 4*  CREATININE 0.54 0.56  CALCIUM 8.7 8.6   PT/INR No results found for this basename: LABPROT:2,INR:2 in the last 72 hours ABG No results found for this basename: PHART:2,PCO2:2,PO2:2,HCO3:2 in the last 72 hours  Studies/Results: No results found.  Anti-infectives: Anti-infectives     Start     Dose/Rate Route Frequency Ordered Stop   07/04/11 1800   piperacillin-tazobactam (ZOSYN) IVPB 3.375 g        3.375 g 100 mL/hr over 30 Minutes Intravenous 4 times per day 07/04/11 1703            Assessment/Plan: s/p * No surgery found * This is certainly a tough problem.  She says that she feels better but still pretty tender to my exam.  Antibiotics started yesterday.  Hopefully she will continue to improve with nonop management.  LOS: 4 days    Lodema Pilot DAVID 07/05/2011

## 2011-07-05 NOTE — Progress Notes (Signed)
Subjective: Patient reports no problems voiding.  Pt states that her pain is improved. She feels hungry.   Objective: I have reviewed patient's vital signs, intake and output, medications and labs.  General: alert and cooperative GI: abdomen is soft tender to mild palpation in the epigastric region and RUQ . no left upper quadrant tenderness + BS  Extremities: extremities normal, atraumatic, no cyanosis or edema   Assessment/Plan: HD 4 cholelithiasis/ pancreatitis ?cholecystitis Continue IV zosyn Amylase and lipase pending.  Pt still has pain to mild palpation in ruq. And epigastric region. NPO for now.. If amylase and lipase are improving significantly from yesterday consider sips of clears.   LOS: 4 days    Shelby Paul J. 07/05/2011, 3:32 PM

## 2011-07-06 LAB — COMPREHENSIVE METABOLIC PANEL
Albumin: 2.9 g/dL — ABNORMAL LOW (ref 3.5–5.2)
BUN: 3 mg/dL — ABNORMAL LOW (ref 6–23)
Creatinine, Ser: 0.54 mg/dL (ref 0.50–1.10)
GFR calc Af Amer: >90 mL/min (ref >90)
Glucose, Bld: 100 mg/dL — ABNORMAL HIGH (ref 70–99)
Total Bilirubin: 0.7 mg/dL (ref 0.3–1.2)
Total Protein: 6.3 g/dL (ref 6.0–8.3)

## 2011-07-06 LAB — LIPASE, BLOOD: Lipase: 247 U/L — ABNORMAL HIGH (ref 11–59)

## 2011-07-06 LAB — CBC
Hemoglobin: 10.8 g/dL — ABNORMAL LOW (ref 12.0–15.0)
MCHC: 34.5 g/dL (ref 30.0–36.0)
Platelets: 154 10*3/uL (ref 150–400)
RBC: 3.74 MIL/uL — ABNORMAL LOW (ref 3.87–5.11)

## 2011-07-06 LAB — AMYLASE: Amylase: 368 U/L — ABNORMAL HIGH (ref 0–105)

## 2011-07-06 NOTE — Progress Notes (Addendum)
Patient ID: Shelby Paul, female   DOB: 1980/04/28, 31 y.o.   MRN: [id number] Hospital Day: 6  S: states she is better but just a little  O: Blood pressure 100/65, pulse 58, temperature 98.4 F (36.9 C), temperature source Oral, resp. rate 16, height 5\' 4"  (1.626 m), weight 77.253 kg (170 lb 5 oz), last menstrual period 04/24/2011, SpO2 99.00%.  Lab 07/06/11 0535  AMYLASE 368*  LIPASE 247*  ]     A/P- 31 y.o. admitted with: gallstone pancreatitis and now 9w    Plan to await the return to normal of her lipase and amylase as sign of resolved pancreatitis  Consider nutrition plan on this THU as in agreement with MFM Continue IV hydration and serial labs and NPO [place] this week to confirm viability Consider GI involvement   Patient Active Hospital Problem List: No active hospital problems.   Pregnancy Complications: gallstone pancreatitis

## 2011-07-06 NOTE — Progress Notes (Signed)
  Subjective: She says that she continues to feel better, asking for food. Pain improved and did not ask for pain meds all day    Objective: Vital signs in last 24 hours: Temp:  [98.4 F (36.9 C)-98.5 F (36.9 C)] 98.4 F (36.9 C) (05/27 1200) Pulse Rate:  [57-75] 58  (05/27 1200) Resp:  [16] 16  (05/27 1200) BP: (100-108)/(65-74) 100/65 mmHg (05/27 1200) SpO2:  [99 %-100 %] 99 % (05/27 1200) Last BM Date: 06/30/11  Intake/Output from previous day: 05/26 0701 - 05/27 0700 In: 2343.8 [I.V.:2343.8] Out: 1900 [Urine:1900] Intake/Output this shift: Total I/O In: -  Out: 1300 [Urine:1300]  General appearance: alert, cooperative and no distress GI: soft, much less tenderness on exam but still has some RUQ tenderness, ND, no peritoneal signs  Lab Results:   [facility] 07/06/11 0535 07/05/11 0527  WBC 4.6 5.8  HGB 10.8* 10.9*  HCT 31.3* 32.0*  PLT 154 149*   BMET  Basename 07/06/11 0535 07/05/11 0527  NA 134* 133*  K 3.4* 3.5  CL 102 101  CO2 25 24  GLUCOSE 100* 102*  BUN 3* 4*  CREATININE 0.54 0.54  CALCIUM 8.8 8.7   PT/INR No results found for this basename: LABPROT:2,INR:2 in the last 72 hours ABG No results found for this basename: PHART:2,PCO2:2,PO2:2,HCO3:2 in the last 72 hours  Studies/Results: No results found.  Anti-infectives: Anti-infectives     Start     Dose/Rate Route Frequency Ordered Stop   07/04/11 1800   piperacillin-tazobactam (ZOSYN) IVPB 3.375 g        3.375 g 100 mL/hr over 30 Minutes Intravenous 4 times per day 07/04/11 1703            Assessment/Plan: s/p * No surgery found * I am not certain why her lipase continues to rise, but she is improved on clinical exam today and I would be okay with trial of liquid diet. If we are suspicious for gallstone pancreatitis, MRCP may be able to evaluate for stones in bile ducts.  However, she does not have elevated TB or alk phos or obstructive pattern.   LOS: 5 days    Lodema Pilot  DAVID 07/06/2011

## 2011-07-06 NOTE — Progress Notes (Signed)
UR chart review completed.  

## 2011-07-06 NOTE — Progress Notes (Signed)
Pt requesting water to drink following Dr. Delice Lesch visit.  His consult note indicated clear liquids could be restarted, but no order was written.  Phoned Dr. Christell Constant with above information.  He stated pt should remain NPO for now.  Informed pt who verbalized understanding of care plan.

## 2011-07-07 ENCOUNTER — Inpatient Hospital Stay (HOSPITAL_COMMUNITY): Payer: No Typology Code available for payment source

## 2011-07-07 MED ORDER — DEXTROSE 5 % IV BOLUS
1000.0000 mL | Freq: Once | INTRAVENOUS | Status: AC
  Administered 2011-07-07: 1000 mL via INTRAVENOUS

## 2011-07-07 NOTE — Discharge Summary (Deleted)
  Subjective: Smiling, talking on phone, NPO, pain is better.  Objective: Vital signs in last 24 hours: Temp:  [98.2 F (36.8 C)-98.6 F (37 C)] 98.2 F (36.8 C) (05/28 1200) Pulse Rate:  [59-63] 60  (05/28 1200) Resp:  [18] 18  (05/28 1200) BP: (100-110)/(67-76) 110/75 mmHg (05/28 1200) SpO2:  [98 %-100 %] 99 % (05/28 1200) Weight:  [73.936 kg (163 lb)] 73.936 kg (163 lb) (05/28 0548) Last BM Date: 06/30/11  Nothing PO recorded, afebrile, VSS, no labs today.Lipase has been going up.  Intake/Output from previous day: 05/27 0701 - 05/28 0700 In: 4200 [I.V.:4150; IV Piggyback:50] Out: 4100 [Urine:4100] Intake/Output this shift: Total I/O In: 950 [IV Piggyback:950] Out: 1800 [Urine:1800]  General appearance: alert, cooperative and no distress GI: soft, much less tender than last week when I saw her, no distension +BS  Lab Results:   [facility] 07/06/11 0535 07/05/11 0527  WBC 4.6 5.8  HGB 10.8* 10.9*  HCT 31.3* 32.0*  PLT 154 149*    BMET  Basename 07/06/11 0535 07/05/11 0527  NA 134* 133*  K 3.4* 3.5  CL 102 101  CO2 25 24  GLUCOSE 100* 102*  BUN 3* 4*  CREATININE 0.54 0.54  CALCIUM 8.8 8.7   PT/INR No results found for this basename: LABPROT:2,INR:2 in the last 72 hours   Lab 07/06/11 0535 07/05/11 0527 07/04/11 0519 07/03/11 0530 07/01/11 1900  AST 11 9 11 13 19   ALT 14 15 18 23 31   ALKPHOS 56 48 48 43 42  BILITOT 0.7 0.6 0.7 0.8 0.5  PROT 6.3 5.6* 5.9* 6.0 6.2  ALBUMIN 2.9* 2.8* 2.9* 3.1* 3.6     Lipase     Component Value Date/Time   LIPASE 247* 07/06/2011 0535     Studies/Results: US Ob Comp Less 14 Wks  07/07/2011  OBSTETRICAL ULTRASOUND: This exam was performed within a [place] [facility]. The OB US report was generated in the AS system, and faxed to the ordering physician.   This report is also available in [employer] and in the [employer]. See AS Obstetric US report.    Medications:    .  dextrose  1,000 mL Intravenous Once  . famotidine (PEPCID) IV  20 mg Intravenous Q12H  . pantoprazole (PROTONIX) IV  40 mg Intravenous Q24H  . piperacillin-tazobactam  3.375 g Intravenous Q6H  . DISCONTD: sodium chloride  1,000 mL Intravenous Once    Assessment/Plan Gallstone Pancreatitis with impacted gallstone in neck of gallbladder  [redacted] weeks gestation   Plan:  Clear liquids tonight, recheck labs, AM.     LOS: 6 days    Vimal Derego 07/07/2011

## 2011-07-07 NOTE — Progress Notes (Signed)
Patient ID: Shelby Paul, female   DOB: [birth year], 31 y.o.   MRN: [id number] Hospital Day: 7  S: states she is better but just a little and states she is having hunger pains that are causing her HA no bleeding no cramping Ambulating well   O: Blood pressure 100/69, pulse 59, temperature 98.4 F (36.9 C), temperature source Oral, resp. rate 18, height 5\' 4"  (1.626 m), weight 73.936 kg (163 lb), last menstrual period 04/24/2011, SpO2 100.00%.  Alert and interactive and the abdominal exam is nonacute and is less tender today than any day prior and ext nontender    A/P- 31 y.o. admitted with:  gallstone pancreatitis and now 9w  Plan to await the return to normal of her lipase and amylase as sign of resolved pancreatitis and will check labs in am Consider nutrition plan on this THU as in agreement with MFM  Continue IV hydration and serial labs and NPO and will give one liter bolus to help with hydration and HA [place] today Consider GI involvement  Patient Active Hospital Problem List: No active hospital problems.   Pregnancy Complications: gallstone pancreatitis

## 2011-07-07 NOTE — Progress Notes (Signed)
Lab tech came to draw labs from the pt; pt refused.  Pt stated she did not want any more labs drawn and she just wants to go home.  MD seeing pt now; will continue to monitor.

## 2011-07-07 NOTE — Progress Notes (Signed)
Pt requested that physician be contacted.  Pt is frustrated and upset that she cannot eat and feels she does not know what is going on with her care.  Pt states she has had a hunger headache for a while and it is preventing her from resting adequately.  Pt states she wants to leave the hospital right now.  MD contacted; no change in orders.  Pt is to remain NPO.  Communicated to pt MD orders are to stay in place.  Retrieved a cold washcloth for pt to place on her head and stayed by the bedside which seemed to calm the pt.  Pt states she does not want any more labs drawn, but after explaining to her the purpose she stated she will see how she feels in the morning. Will continue to monitor.

## 2011-07-07 NOTE — Progress Notes (Signed)
UR Chart review completed.  

## 2011-07-08 LAB — COMPREHENSIVE METABOLIC PANEL
ALT: 19 U/L (ref 0–35)
Albumin: 3 g/dL — ABNORMAL LOW (ref 3.5–5.2)
Alkaline Phosphatase: 65 U/L (ref 39–117)
BUN: 3 mg/dL — ABNORMAL LOW (ref 6–23)
Calcium: 8.8 mg/dL (ref 8.4–10.5)
GFR calc Af Amer: >90 mL/min (ref >90)
Glucose, Bld: 98 mg/dL (ref 70–99)
Potassium: 3.6 mEq/L (ref 3.5–5.1)
Sodium: 133 mEq/L — ABNORMAL LOW (ref 135–145)
Total Protein: 6.1 g/dL (ref 6.0–8.3)

## 2011-07-08 LAB — CBC
HCT: 31.6 % — ABNORMAL LOW (ref 36.0–46.0)
Hemoglobin: 10.7 g/dL — ABNORMAL LOW (ref 12.0–15.0)
MCV: 83.2 fL (ref 78.0–100.0)
WBC: 4.4 10*3/uL (ref 4.0–10.5)

## 2011-07-08 LAB — DIFFERENTIAL
Eosinophils Relative: 2 % (ref 0–5)
Lymphocytes Relative: 37 % (ref 12–46)
Monocytes Absolute: 0.4 10*3/uL (ref 0.1–1.0)
Monocytes Relative: 9 % (ref 3–12)
Neutro Abs: 2.2 10*3/uL (ref 1.7–7.7)

## 2011-07-08 NOTE — Progress Notes (Signed)
07/07/11  PM note:  The original was on D/c summary format. Subjective: No complaints of discomfort.  She didn't get clears last PM. Will try today, chemistries are pending.  Objective: Vital signs in last 24 hours: Temp:  [97.9 F (36.6 C)-98.8 F (37.1 C)] 98.4 F (36.9 C) (05/29 0610) Pulse Rate:  [60-71] 67  (05/29 0610) Resp:  [18] 18  (05/29 0610) BP: (91-112)/(57-75) 91/57 mmHg (05/29 0610) SpO2:  [99 %-100 %] 100 % (05/28 2147) Last BM Date: 07/01/11  Intake/Output from previous day: 05/28 0701 - 05/29 0700 In: 4035 [I.V.:2935; IV Piggyback:1100] Out: 3300 [Urine:3300] Intake/Output this shift: Total I/O In: 835 [I.V.:685; IV Piggyback:150] Out: 550 [Urine:550]  General appearance: alert, cooperative and no distress GI: soft, non-tender; bowel sounds normal; no masses,  no organomegaly and she appears to feel better, no pain on palation currently.  Lab Results:   [facility] 07/08/11 0555 07/06/11 0535  WBC 4.4 4.6  HGB 10.7* 10.8*  HCT 31.6* 31.3*  PLT 161 154    BMET  Basename 07/06/11 0535  NA 134*  K 3.4*  CL 102  CO2 25  GLUCOSE 100*  BUN 3*  CREATININE 0.54  CALCIUM 8.8   PT/INR No results found for this basename: LABPROT:2,INR:2 in the last 72 hours   Lab 07/06/11 0535 07/05/11 0527 07/04/11 0519 07/03/11 0530 07/01/11 1900  AST 11 9 11 13 19   ALT 14 15 18 23 31   ALKPHOS 56 48 48 43 42  BILITOT 0.7 0.6 0.7 0.8 0.5  PROT 6.3 5.6* 5.9* 6.0 6.2  ALBUMIN 2.9* 2.8* 2.9* 3.1* 3.6     Lipase     Component Value Date/Time   LIPASE 247* 07/06/2011 0535     Studies/Results: US Ob Comp Less 14 Wks  07/07/2011  OBSTETRICAL ULTRASOUND: This exam was performed within a [place] [facility] Department. The OB US report was generated in the AS system, and faxed to the ordering physician.   This report is also available in [employer] and in the [employer]. See [facility] Obstetric US report.    Medications:    . dextrose  1,000  mL Intravenous Once  . famotidine (PEPCID) IV  20 mg Intravenous Q12H  . pantoprazole (PROTONIX) IV  40 mg Intravenous Q24H  . piperacillin-tazobactam  3.375 g Intravenous Q6H    Assessment/Plan Gallstone Pancreatitis with impacted gallstone in neck of gallbladder  [redacted] weeks gestation  Plan:  Labs are pending, we will start with clear liquids and see how she does.     LOS: 7 days    JENNINGS,WILLARD 07/08/2011  Nurse took prerogative to ignore diet order and not feed patient.  WBC normal.  Other AM labs are pending.  She clinically looks good.  Ovidio Kin, MD, [facility] Surgery Pager: [phone] Office phone:  [phone]

## 2011-07-08 NOTE — Progress Notes (Signed)
Subjective: Smiling, talking on phone, NPO, pain is better.   Objective: Vital signs in last 24 hours: Temp:  [98.2 F (36.8 C)-98.6 F (37 C)] 98.2 F (36.8 C) (05/28 1200) Pulse Rate:  [59-63] 60  (05/28 1200) Resp:  [18] 18  (05/28 1200) BP: (100-110)/(67-76) 110/75 mmHg (05/28 1200) SpO2:  [98 %-100 %] 99 % (05/28 1200) Weight:  [73.936 kg (163 lb)] 73.936 kg (163 lb) (05/28 0548) Last BM Date: 06/30/11   Nothing PO recorded, afebrile, VSS, no labs today.Lipase has been going up.   Intake/Output from previous day: 05/27 0701 - 05/28 0700 In: 4200 [I.V.:4150; IV Piggyback:50] Out: 4100 [Urine:4100] Intake/Output this shift: Total I/O In: 950 [IV Piggyback:950] Out: 1800 [Urine:1800]   General appearance: alert, cooperative and no distress GI: soft, much less tender than last week when I saw her, no distension +BS   Lab Results:  [facility]  07/06/11 0535  07/05/11 0527   WBC  4.6  5.8   HGB  10.8*  10.9*   HCT  31.3*  32.0*   PLT  154  149*      BMET Basename  07/06/11 0535  07/05/11 0527   NA  134*  133*   K  3.4*  3.5   CL  102  101   CO2  25  24   GLUCOSE  100*  102*   BUN  3*  4*   CREATININE  0.54  0.54   CALCIUM  8.8  8.7    PT/INR No results found for this basename: LABPROT:2,INR:2 in the last 72 hours   Lab  07/06/11 0535  07/05/11 0527  07/04/11 0519  07/03/11 0530  07/01/11 1900   AST  11  9  11  13  19    ALT  14  15  18  23  31    ALKPHOS  56  48  48  43  42   BILITOT  0.7  0.6  0.7  0.8  0.5   PROT  6.3  5.6*  5.9*  6.0  6.2   ALBUMIN  2.9*  2.8*  2.9*  3.1*  3.6        Lipase      Component  Value  Date/Time     LIPASE  247*  07/06/2011 0535       Studies/Results: US Ob Comp Less 14 Wks   07/07/2011  OBSTETRICAL ULTRASOUND: This exam was performed within a [place] [facility] Department. The OB US report was generated in the AS system, and faxed to the ordering physician.   This report is also available in [employer] and in the [employer]. See [facility] Obstetric US report.     Medications:    .  dextrose   1,000 mL  Intravenous  Once   .  famotidine (PEPCID) IV   20 mg  Intravenous  Q12H   .  pantoprazole (PROTONIX) IV   40 mg  Intravenous  Q24H   .  piperacillin-tazobactam   3.375 g  Intravenous  Q6H   .  DISCONTD: sodium chloride   1,000 mL  Intravenous  Once      Assessment/Plan Gallstone Pancreatitis with impacted gallstone in neck of gallbladder   [redacted] weeks gestation   Plan:  Clear liquids tonight, recheck labs, AM.  LOS: 6 days    JENNINGS,WILLARD 07/07/2011   Agree with above.  Ovidio Kin, MD, [facility] Surgery Pager: [phone] Office phone:  [phone]

## 2011-07-08 NOTE — Progress Notes (Signed)
INITIAL ADULT NUTRITION ASSESSMENT Date: 07/08/2011   Time: 3:16 PM Reason for Assessment: 7 th day of NPO or C/L diet  ASSESSMENT: Female 31 y.o.  MV:HQION is no problem list on file for this patient. Gallstone pancreatitis Hx:  Past Medical History  Diagnosis Date  . Anxiety    Related Meds:     . famotidine (PEPCID) IV  20 mg Intravenous Q12H  . pantoprazole (PROTONIX) IV  40 mg Intravenous Q24H  . piperacillin-tazobactam  3.375 g Intravenous Q6H    Ht: 5\' 4"  (162.6 cm)  Wt: 163 lb (73.936 kg)  Ideal Wt: 54.7 kg 59.2 kg % Ideal Wt: 120 Lbs  Usual Wt: 170 Lbs % Usual Wt: 96%  Body mass index is 27.98 kg/(m^2). Usual BMI 29.3  Food/Nutrition Related Hx: N/V at time of admission. NPO for 6 days as unable to tolerate clears without pain and vomiting. Able to tolerate clears today, pain improved Pt with a 4 % loss of usual weight since admission. Labs: CMP     Component Value Date/Time   NA 133* 07/08/2011 0555   K 3.6 07/08/2011 0555   CL 100 07/08/2011 0555   CO2 22 07/08/2011 0555   GLUCOSE 98 07/08/2011 0555   BUN 3* 07/08/2011 0555   CREATININE 0.54 07/08/2011 0555   CALCIUM 8.8 07/08/2011 0555   PROT 6.1 07/08/2011 0555   ALBUMIN 3.0* 07/08/2011 0555   AST 15 07/08/2011 0555   ALT 19 07/08/2011 0555   ALKPHOS 65 07/08/2011 0555   BILITOT 0.7 07/08/2011 0555   GFRNONAA >90 07/08/2011 0555   GFRAA >90 07/08/2011 0555     Intake:   Intake/Output Summary (Last 24 hours) at 07/08/11 1520 Last data filed at 07/08/11 1342  Gross per 24 hour  Intake 5077.5 ml  Output   3950 ml  Net 1127.5 ml   Output:   Diet Order: Clear Liquid  Supplements/Tube Feeding:  IVF:    dextrose 5 %-0.45% nacl with kcl Last Rate: 125 mL/hr at 07/08/11 1203  Provides  510 Kcal/day  Estimated Nutritional Needs: first trimester   Kcal: 15-1700 Protein: 60-72 g Fluid:  1.7 L  NUTRITION DIAGNOSIS: -Malnutrition (NI-5.2).  Status: Ongoing  RELATED TO: NPO status and inability to  eat secondary to N/V and pain  AS EVIDENCE BY: > 2 % loss of usual weight in 7 days and po intake that is </= 50 % of estimated needs for 5 days  MONITORING/EVALUATION(Goals): Improved tolerance of diet and advancement to low fat diet  EDUCATION NEEDS: -Education needs addressed Extensive discussion of home diet/ low fat diet, to be followed in the event that pt is able to advance to this and be discharged  INTERVENTION: If pt unable to be advanced to low fat diet from clears after 5/30, would consider tube feeding of an elemental formula, continuous ng Vital AF 1.2 to start at 20 ml/hr CNG and advance by 10 ml q 8 hours to a goal rate of 55 ml/hr. ( 1584 Kcal, 99 g protein ) Recommended cng feeds vs jejunal feeds to avoid radiographic placement  Dietitian #:[phone]  DOCUMENTATION CODES Per approved criteria  -Severe malnutrition in the context of acute illness or injury    Jernie Schutt,KATHY 07/08/2011, 3:16 PM

## 2011-07-08 NOTE — Progress Notes (Signed)
Patient ID: Shelby Paul, female   DOB: 1980/04/14, 32 y.o.   MRN: [id number] Hospital Day: 7  s.    states she is better   states  that her hunger pangs are gone   she has tolerated some clears and that her HA is gone   no bleeding no cramping  Ambulating well   O: Temp:  [97.8 F (36.6 C)-98.8 F (37.1 C)] 97.8 F (36.6 C) (05/29 1155) Pulse Rate:  [53-71] 53  (05/29 1155) Resp:  [18] 18  (05/29 1155) BP: (91-112)/(57-72) 104/70 mmHg (05/29 1155) SpO2:  [100 %] 100 % (05/29 1155)  Alert and interactive  abdominal exam is nonacute and is less tender today than any day prior   ext nontender    Recent Labs  Basename 07/08/11 0555   AMYLASE 485*   LIPASE 505*  ] A/P- 31 y.o. admitted with:  gallstone pancreatitis clinically improving but amylase and lipase have increased IUP @ 9w    Plan to await the return to normal of her lipase and amylase as sign of resolved pancreatitis and will check labs in am Consider nutrition plan on this THU as in agreement with MFM  Continue IV hydration and serial labs and NPO and will give one liter bolus to help with hydration and HA  Consider GI involvement   MFM has been consulted and reports outcomes are better with surgical intervention  Will consult again with surgery today to develop plan with regard to length of time on ABX and nutrition needs and surgical needs and GI consult needs   Patient Active Hospital Problem List: No active hospital problems.   Pregnancy Complications: gallstone pancreatitis 29-May-13

## 2011-07-09 LAB — HEPATIC FUNCTION PANEL
ALT: 24 U/L (ref 0–35)
AST: 19 U/L (ref 0–37)
Albumin: 3.3 g/dL — ABNORMAL LOW (ref 3.5–5.2)
Bilirubin, Direct: 0.3 mg/dL (ref 0.0–0.3)
Total Protein: 7.3 g/dL (ref 6.0–8.3)

## 2011-07-09 LAB — AMYLASE: Amylase: 475 U/L — ABNORMAL HIGH (ref 0–105)

## 2011-07-09 LAB — LIPASE, BLOOD: Lipase: 455 U/L — ABNORMAL HIGH (ref 11–59)

## 2011-07-09 NOTE — Progress Notes (Signed)
Diet education for Fat modified diet completed  As per note 5/29, RD had long discussion with pt about home diet/ fat modified diet. Today a copy of written instructions for fat modified diet was given to patient. She has been advised to avoid high fat foods, fried foods, foods that cause gas and foods with strong odors.

## 2011-07-09 NOTE — Progress Notes (Signed)
Subjective:  Pt reports no pain this AM or yesterday.  Objective: Vital signs in last 24 hours: Temp:  [97.7 F (36.5 C)-98.5 F (36.9 C)] 98.3 F (36.8 C) (05/30 0535) Pulse Rate:  [51-55] 55  (05/30 0535) Resp:  [15-18] 16  (05/30 0535) BP: (92-104)/(60-70) 96/65 mmHg (05/30 0535) SpO2:  [100 %] 100 % (05/30 0535) Weight:  [73.624 kg (162 lb 5 oz)-73.936 kg (163 lb)] 73.624 kg (162 lb 5 oz) (05/30 0535) Last BM Date: 06/30/11  1290 ml clears yesterday, afebrile, VSS,  Intake/Output from previous day: 05/29 0701 - 05/30 0700 In: 4385.8 [P.O.:1290; I.V.:2945.8; IV Piggyback:150] Out: 3850 [Urine:3850] Intake/Output this shift: Total I/O In: 3575.8 [P.O.:480; I.V.:2945.8; IV Piggyback:150] Out: 1550 [Urine:1550]  General appearance: alert, cooperative and no distress GI: soft, non-tender; bowel sounds normal; no masses,  no organomegaly  Lab Results:   [facility] 07/08/11 0555  WBC 4.4  HGB 10.7*  HCT 31.6*  PLT 161    BMET  Basename 07/08/11 0555  NA 133*  K 3.6  CL 100  CO2 22  GLUCOSE 98  BUN 3*  CREATININE 0.54  CALCIUM 8.8   PT/INR No results found for this basename: LABPROT:2,INR:2 in the last 72 hours   Lab 07/08/11 0555 07/06/11 0535 07/05/11 0527 07/04/11 0519 07/03/11 0530  AST 15 11 9 11 13   ALT 19 14 15 18 23   ALKPHOS 65 56 48 48 43  BILITOT 0.7 0.7 0.6 0.7 0.8  PROT 6.1 6.3 5.6* 5.9* 6.0  ALBUMIN 3.0* 2.9* 2.8* 2.9* 3.1*     Lipase     Component Value Date/Time   LIPASE 505* 07/08/2011 0555  .amalyase                    485                                                                  07/08/11       Studies/Results: US Ob Comp Less 14 Wks  07/07/2011  OBSTETRICAL ULTRASOUND: This exam was performed within a [facility]. The OB US report was generated in the [facility] system, and faxed to the ordering physician.   This report is also available in [employer] and in the [employer]. See [facility]  Obstetric US report.    Medications:    . famotidine (PEPCID) IV  20 mg Intravenous Q12H  . pantoprazole (PROTONIX) IV  40 mg Intravenous Q24H  . piperacillin-tazobactam  3.375 g Intravenous Q6H    Assessment/Plan Gallstone Pancreatitis with impacted gallstone in neck of gallbladder  [redacted] weeks gestation    Plan:  Despite rise in lipase/ amylase, normal LFT's, and being placed on clear liquid diet; she denies pain and is asking when she can go home. No labs this AM  I will get LFT's amylase and lipase again.   Dr. Ezzard Standing thinks we should get GI involved.   The aim is not to operate till she is into 2nd trimester, if possible. She would get ionizing radiation for the cholangiogram.  She has had 5 days on Piperacillin-tazobactam.   LOS: 8 days    JENNINGS,WILLARD 07/09/2011  Agree with above.  Ovidio Kin, MD, [facility] Surgery Pager: [phone] Office phone:  [phone]    

## 2011-07-09 NOTE — Progress Notes (Signed)
Patient ID: Shelby Paul, female   DOB: [birth year], 31 y.o.   MRN: [id number] Patient ID: Shelby Paul, female   DOB: [birth year], 31 y.o.   MRN: [id number] Hospital Day:8 s.    states she is better and ready to go home  states  that her hunger pangs are gone   she has tolerated some clears and that her HA is gone   no bleeding no cramping  Ambulating well   O:Temp:  [97.7 F (36.5 C)-98.5 F (36.9 C)] 98.3 F (36.8 C) (05/30 0535) Pulse Rate:  [51-55] 55  (05/30 0535) Resp:  [15-18] 16  (05/30 0535) BP: (92-104)/(60-70) 96/65 mmHg (05/30 0535) SpO2:  [100 %] 100 % (05/30 0535) Weight:  [73.624 kg (162 lb 5 oz)] 73.624 kg (162 lb 5 oz) (05/30 0535) Alert and interactive  abdominal exam is nonacute and is without any tenderness  ext nontender    Recent Labs  [facility] 07/09/11 0735   AMYLASE 475*   LIPASE 455*  ] A/P- 31 y.o. admitted with:  gallstone pancreatitis clinically improving but amylase and lipase have increased IUP @ 9w 6d   Plan to await the return to normal of her lipase and amylase as sign of resolved pancreatitis and will check labs in am Will consult GI - regarding concerns about the continued elevation of amylase and lipase and whether or not there is a role ERCP   The plan is to laparoscopic cholecystectomy around 14 weks so pt would need appt with surgeon in 2 weeks for preop visit   Continue IV hydration and advance diet if ok with GI and possibly home tomorrow   D/c antibiotics  MFM has been consulted and reports outcomes are better with surgical intervention    Patient Active Hospital Problem List: No active hospital problems.   Pregnancy Complications: gallstone pancreatitis 29-May-13

## 2011-07-09 NOTE — Consult Note (Signed)
Reason for Consult:Gallstone pancreatitis Referring Physician: Dr. Vertell Limber HPI: This is a 31 year old female without any significant PMH admitted for a gallstone pancreatitis.  Initially her symptoms were felt to be secondary to GERD, but she did not respond to medical treatment.  Further evaluation revealed an elevated lipase and gallstones with one stone lodged in the gallbladder neck.  The CBD was measured to be 4 mm in diameter.  Over the interval time period the patient feels better and she denies any abdominal pain.  Her lipase was noted to be increasing, but clinically she does not display any evidence of pancreatitis with the clear liquids.  As a result of her increasing lipase a GI consultation was requested.  Currently the patient is 9.5 weeks into her fourth pregnancy.  Surgery has seen the patient.  Past Medical History  Diagnosis Date  . Anxiety     History reviewed. No pertinent past surgical history.  History reviewed. No pertinent family history.  Social History:  reports that she has never smoked. She has never used smokeless tobacco. She reports that she does not drink alcohol or use illicit drugs.  Allergies: No Known Allergies  Medications:  Scheduled:   . famotidine (PEPCID) IV  20 mg Intravenous Q12H  . pantoprazole (PROTONIX) IV  40 mg Intravenous Q24H  . DISCONTD: piperacillin-tazobactam  3.375 g Intravenous Q6H   Continuous:   . dextrose 5 %-0.45% nacl with kcl 125 mL/hr at 07/09/11 0534    Results for orders placed during the hospital encounter of 07/01/11 (from the past 24 hour(s))  LIPASE, BLOOD     Status: Abnormal   Collection Time   07/09/11  7:35 AM      Component Value Range   Lipase 455 (*) 11 - 59 (U/L)  AMYLASE     Status: Abnormal   Collection Time   07/09/11  7:35 AM      Component Value Range   Amylase 475 (*) 0 - 105 (U/L)  HEPATIC FUNCTION PANEL     Status: Abnormal   Collection Time   07/09/11  7:35 AM      Component  Value Range   Total Protein 7.3  6.0 - 8.3 (g/dL)   Albumin 3.3 (*) 3.5 - 5.2 (g/dL)   AST 19  0 - 37 (U/L)   ALT 24  0 - 35 (U/L)   Alkaline Phosphatase 73  39 - 117 (U/L)   Total Bilirubin 0.7  0.3 - 1.2 (mg/dL)   Bilirubin, Direct 0.3  0.0 - 0.3 (mg/dL)   Indirect Bilirubin 0.4  0.3 - 0.9 (mg/dL)     No results found.  ROS:  As stated above in the HPI otherwise negative.  Blood pressure 99/64, pulse 56, temperature 98.1 F (36.7 C), temperature source Oral, resp. rate 18, height 5\' 4"  (1.626 m), weight 73.483 kg (162 lb), last menstrual period 04/24/2011, SpO2 99.00%.    PE: Gen: NAD, Alert and Oriented HEENT:  [place]/AT, EOMI Neck: Supple, no LAD Lungs: CTA Bilaterally CV: RRR without M/G/R ABM: Soft, NTND, +BS Ext: No C/C/E  Assessment/Plan: 1) Gallstone pancreatitis. 2) 9.[redacted] week gestation.   The numbers may reflect a peaking of her values of amylase and lipase since admission.  It is unusual to see such a long time progression with the peaking of her numbers, but today's values reflect a possible trending downwards.  The more important point is that she does not have any clinical evidence of pancreatitis at  this time, i.e., no pain, nausea, or vomiting.  In this instance, i.e., no clinical evidence of pancreatitis in the face of increasing amylase/lipase, no concern for a worsening of pancreatitis or a recurrence is necessary.  There is no role for an ERCP in this patient as her CBD is 4 mm, which is normal.  Ultimately she will require a cholecystectomy and I will leave the timing up to surgery.  At this time point I will advance her diet.  She may or may not have a recurrence with PO intake, but it is a risk as she still has her gallbladder.  Any type of PO intake will stimulate the gallbladder to contract, but it would be prudent to avoid fatty meals.  Plan: 1) Advance diet to a low fat diet. 2) Okay to D/C home per Obstetrics. 3) Only recheck lipase if the patient has  recurrent pain, in this setting. 4) I will sign off at this time, but please call with any questions. 5) Cholecystectomy per Surgery.  Anastasios Melander D 07/09/2011, 1:51 PM

## 2011-07-09 NOTE — Progress Notes (Signed)
UR Chart review completed.  

## 2011-07-10 LAB — LIPASE, BLOOD: Lipase: 251 U/L — ABNORMAL HIGH (ref 11–59)

## 2011-07-10 NOTE — Progress Notes (Signed)
Pt discharged to home with friend.  Condition stable.  Pt ambulated to front door with K. Kilfoyle, NT.  Pt stated her friend would arrive soon and that she was to meet him outside.  No equipment ordered for home at discharge.

## 2011-07-10 NOTE — Progress Notes (Signed)
Hospital Day: 10  S:feels good today.  Eating well.  No pain and wants to go home.  O: Blood pressure 93/59, pulse 54, temperature 98.1 F (36.7 C), temperature source Oral, resp. rate 18, height 5\' 4"  (1.626 m), weight 71.697 kg (158 lb 1 oz), last menstrual period 04/24/2011, SpO2 100.00%.   FHT:   A/P- 31 y.o. admitted with:nausea vomiting, vomiting, and abdominal pain.IV hydration and antiemetics and n.p.o. For 9 days with resolution of symptoms.  The patient is feeling well today taking by mouth and wants to go home. Will discharge home today to be followed up in my office in one week  Patient Active Hospital Problem List: No active hospital problems.   Pregnancy Complications: gallstones and pancreatitis

## 2011-07-10 NOTE — Progress Notes (Signed)
  Subjective: She feels fine, up to low fat diet.  No problems at this point  Objective: Vital signs in last 24 hours: Temp:  [98.1 F (36.7 C)-98.4 F (36.9 C)] 98.1 F (36.7 C) (05/31 0604) Pulse Rate:  [54-79] 54  (05/31 0604) Resp:  [18] 18  (05/31 0604) BP: (93-105)/(59-75) 93/59 mmHg (05/31 0604) SpO2:  [99 %-100 %] 100 % (05/31 0604) Weight:  [71.697 kg (158 lb 1 oz)-73.483 kg (162 lb)] 71.697 kg (158 lb 1 oz) (05/31 0604) Last BM Date: 06/30/11  Intake/Output from previous day: 05/30 0701 - 05/31 0700 In: 120 [P.O.:120] Out: 1050 [Urine:1050] Intake/Output this shift:    General appearance: alert, cooperative and no distress GI: soft, non-tender; bowel sounds normal; no masses,  no organomegaly  Lab Results:   [facility] 07/08/11 0555  WBC 4.4  HGB 10.7*  HCT 31.6*  PLT 161    BMET  Basename 07/08/11 0555  NA 133*  K 3.6  CL 100  CO2 22  GLUCOSE 98  BUN 3*  CREATININE 0.54  CALCIUM 8.8   PT/INR No results found for this basename: LABPROT:2,INR:2 in the last 72 hours   Lab 07/09/11 0735 07/08/11 0555 07/06/11 0535 07/05/11 0527 07/04/11 0519  AST 19 15 11 9 11   ALT 24 19 14 15 18   ALKPHOS 73 65 56 48 48  BILITOT 0.7 0.7 0.7 0.6 0.7  PROT 7.3 6.1 6.3 5.6* 5.9*  ALBUMIN 3.3* 3.0* 2.9* 2.8* 2.9*     Lipase     Component Value Date/Time   LIPASE 455* 07/09/2011 0735     Studies/Results: No results found.  Medications:    . famotidine (PEPCID) IV  20 mg Intravenous Q12H  . pantoprazole (PROTONIX) IV  40 mg Intravenous Q24H  . DISCONTD: piperacillin-tazobactam  3.375 g Intravenous Q6H    Assessment/Plan Gallstone Pancreatitis [redacted] weeks gestation  Appreciate DR. Hung's help, She is on a low fat diet.  She is tolerating it well.  Potatoes and chicken salad last pm.  Abdomen with any discomfort.  Will leave information on AVS for follow up with DR. Magnus Ivan.  If she has further problems before discharge please give [place] a call.    LOS: 9  days    JENNINGS,WILLARD 07/10/2011  Agree with above.  Ovidio Kin, MD, [facility] Pager: [phone] Office phone:  [phone]

## 2011-07-10 NOTE — Discharge Instructions (Signed)
Gallstones Gallstones are a form of gallbladder disease. The gallbladder is a small organ that helps you digest food.  HOME CARE  Only take medicine as told by your doctor.   Eat a low-fat diet.   Follow up as told.  GET HELP RIGHT AWAY IF:   Your pain gets worse.   You develop yellow skin or eyes (jaundice).   The pain moves to another part of your belly (abdomen) or back.   You have a temperature by mouth above 102 F (38.9 C), not controlled by medicine.   You feel sick to your stomach (nauseous) and throw up (vomit).  MAKE SURE YOU:   Understand these instructions.   Will watch your condition.   Will get help right away if you are not doing well or get worse.  Document Released: 07/15/2007 Document Revised: 01/15/2011 Document Reviewed: 01/01/2009 [employer] Patient Information 2012 [employer]. 

## 2011-07-10 NOTE — Discharge Summary (Signed)
Physician Discharge Summary  Patient ID: Shelby Paul MRN: [id number] DOB/AGE: 1980-12-21 31 y.o.  Admit date: 07/01/2011 Discharge date: 07/10/2011  Admission Diagnoses: 9 week intrauterine pregnancy, gallstones, pancreatitis, abdominal pain, nausea, vomiting  Discharge Diagnoses: 9-1/2 week intrauterine pregnancy, gallstones Active Problems:  * No active hospital problems. *    Discharged Condition: good  Hospital Course: The patient was admitted due to severe nausea, vomiting, abdominal pain after an ultrasound and lab or revealed some gallstones and pancreatitis. The patient was made n.p.o. and a general surgery and a maternal fetal medicine consult was obtained.  After a course of IV hydration and being kept n.p.o and supplemented with IV Zofran and protonix, the abdominal pain and nausea and vomiting did subside.  However amylase and lipase continued to go up.  If the enzymes remain stable.  After the general surgery consult.  It was felt that since the patient's clinical symptoms had subsided that we could advance the diet and the patient was advanced from a clear liquid to full liquid to low fat diet without incident. He a GI consult was obtained on yesterday.  Dr. Lewie Chamber stopped by and evaluated the patient and agree with the present plan. The amylase and lipase levels levelled off and began to drop over the past 2 days.  And the patient is being discharged in good condition.  She will be followed up in my office in one week.  She is to see the general surgeon in 2 weeks.  Consults: GI and general surgery, maternal-fetal medicine  Significant Diagnostic Studies: labs: amylase and lipase level  Treatments: IV hydration, antibiotics:  and analgesia  Discharge Exam: Blood pressure 93/59, pulse 54, temperature 98.1 F (36.7 C), temperature source Oral, resp. rate 18, height 5\' 4"  (1.626 m), weight 71.697 kg (158 lb 1 oz), last menstrual period 04/24/2011, SpO2 100.00%. General  appearance: alert, cooperative and no distress GI: soft, non-tender; bowel sounds normal; no masses,  no organomegaly Extremities: extremities normal, atraumatic, no cyanosis or edema pelvic exam deferred  Disposition: good condition  Medication List  As of 07/10/2011  9:24 AM   ASK your doctor about these medications         ondansetron 8 MG disintegrating tablet   Commonly known as: ZOFRAN-ODT   Take 8 mg by mouth 2 (two) times daily as needed. For nausea      pantoprazole 40 MG tablet   Commonly known as: PROTONIX   Take 40 mg by mouth daily.      pyridOXINE 100 MG tablet   Commonly known as: VITAMIN B-6   Take 100 mg by mouth daily.           Follow-up Information    Schedule an appointment as soon as possible for a visit with [facility] A, MD. (Call and make an appointment in office when your obstetrics doctor thinks your safe to have your gallbladder removed.)    Contact information:   [facility] [address] [place] [address] [phone]          Signed: Fortino Sic 07/10/2011, 9:24 AM

## 2011-08-05 ENCOUNTER — Encounter (INDEPENDENT_AMBULATORY_CARE_PROVIDER_SITE_OTHER): Payer: No Typology Code available for payment source | Admitting: Surgery

## 2011-08-10 LAB — OB RESULTS CONSOLE HIV ANTIBODY (ROUTINE TESTING): HIV: NONREACTIVE

## 2012-01-10 LAB — OB RESULTS CONSOLE RUBELLA ANTIBODY, IGM: Rubella: IMMUNE

## 2012-02-08 ENCOUNTER — Encounter (HOSPITAL_COMMUNITY): Payer: Self-pay

## 2012-02-08 ENCOUNTER — Inpatient Hospital Stay (HOSPITAL_COMMUNITY)
Admission: AD | Admit: 2012-02-08 | Discharge: 2012-02-11 | DRG: 775 | Disposition: A | Discharge status: Home / Self Care | Payer: Medicaid Other | Source: Ambulatory Visit | Attending: Obstetrics & Gynecology | Admitting: Obstetrics & Gynecology

## 2012-02-08 DIAGNOSIS — D649 Anemia, unspecified: Secondary | ICD-10-CM | POA: Diagnosis not present

## 2012-02-08 DIAGNOSIS — K8050 Calculus of bile duct without cholangitis or cholecystitis without obstruction: Secondary | ICD-10-CM | POA: Diagnosis present

## 2012-02-08 DIAGNOSIS — K859 Acute pancreatitis without necrosis or infection, unspecified: Secondary | ICD-10-CM | POA: Diagnosis present

## 2012-02-08 DIAGNOSIS — O26899 Other specified pregnancy related conditions, unspecified trimester: Principal | ICD-10-CM | POA: Diagnosis present

## 2012-02-08 DIAGNOSIS — O9903 Anemia complicating the puerperium: Secondary | ICD-10-CM | POA: Diagnosis not present

## 2012-02-08 HISTORY — DX: Acute pancreatitis without necrosis or infection, unspecified: K85.90

## 2012-02-08 HISTORY — DX: Major depressive disorder, single episode, unspecified: F32.9

## 2012-02-08 HISTORY — DX: Depression, unspecified: F32.A

## 2012-02-08 HISTORY — DX: Calculus of gallbladder without cholecystitis without obstruction: K80.20

## 2012-02-08 LAB — COMPREHENSIVE METABOLIC PANEL
ALT: 12 U/L (ref 0–35)
AST: 20 U/L (ref 0–37)
Albumin: 3 g/dL — ABNORMAL LOW (ref 3.5–5.2)
Alkaline Phosphatase: 159 U/L — ABNORMAL HIGH (ref 39–117)
BUN: 6 mg/dL (ref 6–23)
Chloride: 98 mEq/L (ref 96–112)
Potassium: 3.9 mEq/L (ref 3.5–5.1)
Sodium: 132 mEq/L — ABNORMAL LOW (ref 135–145)
Total Bilirubin: 0.4 mg/dL (ref 0.3–1.2)

## 2012-02-08 LAB — CBC
HCT: 35.3 % — ABNORMAL LOW (ref 36.0–46.0)
Hemoglobin: 11.9 g/dL — ABNORMAL LOW (ref 12.0–15.0)
MCV: 83.6 fL (ref 78.0–100.0)
RBC: 4.22 MIL/uL (ref 3.87–5.11)
WBC: 7.8 10*3/uL (ref 4.0–10.5)

## 2012-02-08 MED ORDER — LACTATED RINGERS IV SOLN: 500.0000 mL | Freq: Once | INTRAVENOUS | Status: DC

## 2012-02-08 MED ORDER — OXYTOCIN 40 UNITS IN LACTATED RINGERS INFUSION - SIMPLE MED
1.0000 m[IU]/min | INTRAVENOUS | Status: DC
  Administered 2012-02-08: 1 m[IU]/min via INTRAVENOUS
  Filled 2012-02-08: qty 1000

## 2012-02-08 MED ORDER — FENTANYL 2.5 MCG/ML BUPIVACAINE 1/10 % EPIDURAL INFUSION (WH - ANES)
14.0000 mL/h | INTRAMUSCULAR | Status: DC
  Administered 2012-02-09 (×2): 14 mL/h via EPIDURAL
  Filled 2012-02-08 (×2): qty 125

## 2012-02-08 MED ORDER — LIDOCAINE HCL (PF) 1 % IJ SOLN
30.0000 mL | INTRAMUSCULAR | Status: DC | PRN
  Filled 2012-02-08: qty 30

## 2012-02-08 MED ORDER — PHENYLEPHRINE 40 MCG/ML (10ML) SYRINGE FOR IV PUSH (FOR BLOOD PRESSURE SUPPORT): 80.0000 ug | PREFILLED_SYRINGE | INTRAVENOUS | Status: DC | PRN

## 2012-02-08 MED ORDER — GENTAMICIN SULFATE 40 MG/ML IJ SOLN
150.0000 mg | Freq: Three times a day (TID) | INTRAVENOUS | Status: DC
  Administered 2012-02-09: 150 mg via INTRAVENOUS
  Filled 2012-02-08 (×4): qty 3.75

## 2012-02-08 MED ORDER — AMPICILLIN SODIUM 2 G IJ SOLR
2.0000 g | Freq: Four times a day (QID) | INTRAMUSCULAR | Status: DC
  Administered 2012-02-08 – 2012-02-09 (×3): 2 g via INTRAVENOUS
  Filled 2012-02-08 (×6): qty 2000

## 2012-02-08 MED ORDER — OXYCODONE-ACETAMINOPHEN 5-325 MG PO TABS: 1.0000 | ORAL_TABLET | ORAL | Status: DC | PRN

## 2012-02-08 MED ORDER — IBUPROFEN 600 MG PO TABS: 600.0000 mg | ORAL_TABLET | Freq: Four times a day (QID) | ORAL | Status: DC | PRN

## 2012-02-08 MED ORDER — TERBUTALINE SULFATE 1 MG/ML IJ SOLN: 0.2500 mg | Freq: Once | INTRAMUSCULAR | Status: AC | PRN

## 2012-02-08 MED ORDER — PHENYLEPHRINE 40 MCG/ML (10ML) SYRINGE FOR IV PUSH (FOR BLOOD PRESSURE SUPPORT)
80.0000 ug | PREFILLED_SYRINGE | INTRAVENOUS | Status: DC | PRN
  Filled 2012-02-08: qty 5

## 2012-02-08 MED ORDER — CITRIC ACID-SODIUM CITRATE 334-500 MG/5ML PO SOLN: 30.0000 mL | ORAL | Status: DC | PRN

## 2012-02-08 MED ORDER — BUTORPHANOL TARTRATE 1 MG/ML IJ SOLN
1.0000 mg | INTRAMUSCULAR | Status: DC | PRN
  Administered 2012-02-08 – 2012-02-09 (×2): 1 mg via INTRAVENOUS
  Filled 2012-02-08 (×2): qty 1

## 2012-02-08 MED ORDER — OXYTOCIN 40 UNITS IN LACTATED RINGERS INFUSION - SIMPLE MED
62.5000 mL/h | INTRAVENOUS | Status: DC
  Administered 2012-02-09: 62.5 mL/h via INTRAVENOUS

## 2012-02-08 MED ORDER — LACTATED RINGERS IV SOLN
INTRAVENOUS | Status: DC
  Administered 2012-02-08 – 2012-02-09 (×2): via INTRAVENOUS

## 2012-02-08 MED ORDER — LACTATED RINGERS IV SOLN
500.0000 mL | INTRAVENOUS | Status: DC | PRN
  Administered 2012-02-09 (×2): 1000 mL via INTRAVENOUS

## 2012-02-08 MED ORDER — DIPHENHYDRAMINE HCL 50 MG/ML IJ SOLN: 12.5000 mg | INTRAMUSCULAR | Status: DC | PRN

## 2012-02-08 MED ORDER — ACETAMINOPHEN 325 MG PO TABS: 650.0000 mg | ORAL_TABLET | ORAL | Status: DC | PRN

## 2012-02-08 MED ORDER — OXYTOCIN BOLUS FROM INFUSION: 500.0000 mL | INTRAVENOUS | Status: DC

## 2012-02-08 MED ORDER — ONDANSETRON HCL 4 MG/2ML IJ SOLN: 4.0000 mg | Freq: Four times a day (QID) | INTRAMUSCULAR | Status: DC | PRN

## 2012-02-08 MED ORDER — EPHEDRINE 5 MG/ML INJ: 10.0000 mg | INTRAVENOUS | Status: DC | PRN

## 2012-02-08 MED ORDER — EPHEDRINE 5 MG/ML INJ
10.0000 mg | INTRAVENOUS | Status: DC | PRN
  Filled 2012-02-08: qty 4

## 2012-02-08 MED ORDER — GENTAMICIN SULFATE 40 MG/ML IJ SOLN
160.0000 mg | Freq: Once | INTRAVENOUS | Status: AC
  Administered 2012-02-08: 160 mg via INTRAVENOUS
  Filled 2012-02-08: qty 4

## 2012-02-08 NOTE — Progress Notes (Signed)
Dr Richardson Dopp called for update on pt status, informed of fhr, uc pattern, pt will be placed on antibiotics for prolonged rupture.

## 2012-02-08 NOTE — MAU Provider Note (Signed)
S: 31 y.o. [id number] @[redacted]w[redacted]d  presents to MAU with leakage of yellow fluid at 3 am with occasional leakage since then.  The pt is not requiring a pad for this leakage.  She reports good fetal movement, denies vaginal bleeding, vaginal itching/burning, urinary symptoms, h/a, dizziness, n/v, or fever/chills.    O: BP 100/70  Pulse 83  Temp 97.1 F (36.2 C) (Oral)  Resp 18  LMP 04/24/2011  FHR baseline 130, positive accels (15x15), negative decels, moderate variability Contractions Q4-5 minutes  Speculum exam: negative pooling of fluid with cough/bearing down, significant normal white/yellow mucous  Ferning negative  Results for orders placed during the hospital encounter of 02/08/12 (from the past 24 hour(s))  AMNISURE RUPTURE OF MEMBRANE (ROM)     Status: Normal   Collection Time   02/08/12  2:35 PM      Component Value Range   Amnisure ROM NEGATIVE     Dilation: 1 Effacement (%): Thick Cervical Position: Posterior Exam by:: Leftwich-Kirby, CNM  A: Intact membranes Threatened labor at term   P: RN to call Dr Nena Polio Leftwich-Kirby Certified Nurse-Midwife  Addendum:  Pt lying supine with slight raise in [facility] while waiting for lab.  After sitting up, large amount of yellow/green fluid soaking pad underneath pt.  Positive ferning for this fluid.    Dr Christell Constant called back to report new findings.  Admit to [employer]

## 2012-02-08 NOTE — MAU Note (Signed)
Patient is in with c/o srom and contractions. She denies any vaginal bleeding. She reports good fetal movement

## 2012-02-08 NOTE — Progress Notes (Signed)
ANTIBIOTIC CONSULT NOTE - INITIAL  Pharmacy Consult for Gentamicin Indication: Prolonged Rupture of Membranes during Labor  No Known Allergies  Patient Measurements: Height: 5\' 4"  (162.6 cm) Weight: 170 lb (77.111 kg) IBW/kg (Calculated) : 54.7  Adjusted Body Weight: 61.4kg    Vital Signs: Temp: 98 F (36.7 C) (12/30 1652) Temp src: Oral (12/30 1652) BP: 119/73 mmHg (12/30 2131) Pulse Rate: 64  (12/30 2131)  Labs:  Basename 02/08/12 1705  WBC 7.8  HGB 11.9*  PLT 133*  LABCREA --  CREATININE 0.47*   Estimated Creatinine Clearance: 103.4 ml/min (by C-G formula based on Cr of 0.47).      Medical History: Past Medical History  Diagnosis Date  . Anxiety   . Gallstones 06/2011  . Pancreatitis 06/2011  . Depression     Medications:  Ampicillin 2 gram IV q6h Assessment: 31yo F 40+ weeks with prolonged rupture of membranes during labor.   Goal of Therapy:  Gentamicin peaks 6-15mcg/ml and trough < 1 mcg/ml.  Plan:  1. Gentamicin 160mg  IV x 1 then 150mg  IV q8h. 2. Will continue to follow and assess need for levels if Gentamicin continued > 72 hours. Thanks! Claybon Jabs 02/08/2012,9:47 PM

## 2012-02-08 NOTE — H&P (Signed)
Shelby Paul is a 31 y.o. G80P2002 female presenting for presumed SROM at 0300 this morning and the pt did not immediately go to the hospital and finally made it to [facility]U this afternoon where initially she was noted to have no pooling and negative fern and negative amnisure.  I discharged the pt with f/u tomorrow - however the pt then had a gush of fluid and I was notified that she was grossly ruptured with light meconium stained fluid.  The pt was sent to L&D for admission.    The pt has been counseled regarding the possibility of infection and the need to deliver rather expeditiously.  We discussed the labor management and the use of pitocin and the possibility of a CSx and the use of forceps and the possibility of vacuum and the hope that we would not have to use any of these.  She was reluctant to use the pitocin which is why she did not come to the hospital earlier and we discussed the increased risk of infection.    She would like to use an epidural for her pain control.   We discussed the fact that she would not get her gallbladder removed at this admission.  The pregnancy has been complicated by gallstone pancreatitis.  Resolved with conservative management and she is to see the general surgeon again after delivery.  An abnormal placenta is noted with a succenturiate lobe without previa.  CBC    Component Value Date/Time   WBC 4.4 07/08/2011 0555   RBC 3.80* 07/08/2011 0555   HGB 10.7* 07/08/2011 0555   HCT 31.6* 07/08/2011 0555   PLT 161 07/08/2011 0555   MCV 83.2 07/08/2011 0555   MCH 28.2 07/08/2011 0555   MCHC 33.9 07/08/2011 0555   RDW 11.5 07/08/2011 0555   LYMPHSABS 1.6 07/08/2011 0555   MONOABS 0.4 07/08/2011 0555   EOSABS 0.1 07/08/2011 0555   BASOSABS 0.0 07/08/2011 0555      Maternal Medical History:  Reason for admission: Reason for admission: rupture of membranes and contractions.  Reason for Admission:   nauseaContractions: Onset was 13-24 hours ago.   Frequency:  irregular.   Duration is approximately 40 seconds.   Perceived severity is moderate.    Fetal activity: Perceived fetal activity is normal.   Last perceived fetal movement was within the past 12 hours.    Prenatal complications: Cholelithiasis and placental abnormality.   No bleeding, HIV, hypertension, infection, IUGR, nephrolithiasis, oligohydramnios, polyhydramnios, pre-eclampsia, preterm labor, substance abuse, thrombocytopenia or thrombophilia.      OB History    Grav Para Term Preterm Abortions TAB SAB Ect Mult Living   4 2 1  1  1   2      Past Medical History  Diagnosis Date  . Anxiety   . Gallstones 06/2011  . Pancreatitis 06/2011   History reviewed. No pertinent past surgical history. Family History: family history is not on file. Social History:  reports that she has never smoked. She has never used smokeless tobacco. She reports that she does not drink alcohol or use illicit drugs.   Prenatal Transfer Tool  Maternal Diabetes: No Genetic Screening: Normal Maternal Ultrasounds/Referrals: Normal Fetal Ultrasounds or other Referrals:  None MFM [place] were done but no other abnormalities noted Maternal Substance Abuse:  No Significant Maternal Medications:  None Significant Maternal Lab Results:  None Other Comments:  succenturiate lobe and gallstone pancreatitis  Review of Systems  Constitutional: Negative for fever, chills, weight loss, malaise/fatigue and diaphoresis.  HENT: Negative for hearing loss, congestion, sore throat and neck pain.   Eyes: Negative for blurred vision, pain, discharge and redness.  Respiratory: Negative for cough, hemoptysis, sputum production, shortness of breath and wheezing.   Cardiovascular: Negative for chest pain, palpitations and leg swelling.  Gastrointestinal: Positive for heartburn and abdominal pain. Negative for nausea, vomiting, diarrhea and constipation.       Occasional heartburn Contraction pain  Genitourinary: Negative for  dysuria, hematuria and flank pain.  Musculoskeletal: Negative for myalgias and back pain.  Skin: Negative for itching and rash.  Neurological: Negative for dizziness, tingling, tremors, speech change, focal weakness, seizures, loss of consciousness and headaches.  Endo/Heme/Allergies: Does not bruise/bleed easily.  Psychiatric/Behavioral: Negative for depression.    Dilation: 2 Effacement (%): 50 Station: -2 Exam by:: dr Christell Constant Blood pressure 109/76, pulse 73, temperature 98 F (36.7 C), temperature source Oral, resp. rate 20, height 5\' 4"  (1.626 m), weight 77.111 kg (170 lb), last menstrual period 04/24/2011. Maternal Exam:  Uterine Assessment: Contraction strength is moderate.  Contraction duration is 40 seconds. Contraction frequency is irregular.   Abdomen: Patient reports no abdominal tenderness. Fundal height is term.   Estimated fetal weight is 7#15.   Fetal presentation: vertex  Introitus: Normal vulva. Vulva is negative for condylomata and lesion.  Normal vagina.  Ferning test: positive.  Amniotic fluid character: meconium stained.  Pelvis: adequate for delivery.   Cervix: Cervix evaluated by digital exam.     Physical Exam  Constitutional: She is oriented to person, place, and time. She appears well-developed and well-nourished. No distress.  HENT:  Head: Normocephalic and atraumatic.  Mouth/Throat: Oropharynx is clear and moist.  Eyes: Pupils are equal, round, and reactive to light. Right eye exhibits no discharge. Left eye exhibits no discharge.  Neck: Normal range of motion. Neck supple. No tracheal deviation present. No thyromegaly present.  Cardiovascular: Normal rate, regular rhythm and normal heart sounds.   Respiratory: Effort normal and breath sounds normal. No respiratory distress. She has no wheezes. She has no rales.  GI: There is no rebound and no guarding.       Gravid nontender  Genitourinary: Vagina normal and uterus normal. Vulva exhibits no lesion.    Musculoskeletal: Normal range of motion. She exhibits no edema and no tenderness.  Lymphadenopathy:    She has no cervical adenopathy.  Neurological: She is alert and oriented to person, place, and time. She has normal reflexes.  Skin: Skin is warm and dry. No rash noted.  Psychiatric: She has a normal mood and affect. Her behavior is normal. Judgment and thought content normal.    Prenatal labs: ABO, Rh: B/Positive/-- (07/01 0000) Antibody: Negative (07/01 0000) Rubella: Immune (12/01 0000) RPR: Nonreactive (07/01 0000)  HBsAg: Negative (07/01 0000)  HIV: Non-reactive (07/01 0000)  GBS: Negative (12/07 0000)   Assessment/Plan: IUP 40w1  SROM H/o gallstone pancreatitis  Succenturiate placenta  Admit for augmentation of labor and delivery  Naliyah Neth H. 02/08/2012, 6:15 PM

## 2012-02-08 NOTE — Progress Notes (Signed)
Dr Christell Constant notified of patient c/o srom, negative fern and amnisure. Negative. Order to discharge home.

## 2012-02-08 NOTE — Plan of Care (Signed)
Problem: Consults Goal: Birthing Suites Patient Information Press F2 to bring up selections list  Outcome: Completed/Met Date Met:  02/08/12  Pt > [redacted] weeks EGA

## 2012-02-09 ENCOUNTER — Inpatient Hospital Stay (HOSPITAL_COMMUNITY): Payer: Medicaid Other | Admitting: Anesthesiology

## 2012-02-09 ENCOUNTER — Encounter (HOSPITAL_COMMUNITY): Payer: Self-pay | Admitting: *Deleted

## 2012-02-09 ENCOUNTER — Encounter (HOSPITAL_COMMUNITY): Payer: Self-pay | Admitting: Anesthesiology

## 2012-02-09 MED ORDER — ZOLPIDEM TARTRATE 5 MG PO TABS: 5.0000 mg | ORAL_TABLET | Freq: Every evening | ORAL | Status: DC | PRN

## 2012-02-09 MED ORDER — MEDROXYPROGESTERONE ACETATE 150 MG/ML IM SUSP: 150.0000 mg | INTRAMUSCULAR | Status: DC | PRN

## 2012-02-09 MED ORDER — DIPHENHYDRAMINE HCL 25 MG PO CAPS: 25.0000 mg | ORAL_CAPSULE | Freq: Four times a day (QID) | ORAL | Status: DC | PRN

## 2012-02-09 MED ORDER — ONDANSETRON HCL 4 MG PO TABS: 4.0000 mg | ORAL_TABLET | ORAL | Status: DC | PRN

## 2012-02-09 MED ORDER — DIBUCAINE 1 % RE OINT: 1.0000 "application " | TOPICAL_OINTMENT | RECTAL | Status: DC | PRN

## 2012-02-09 MED ORDER — IBUPROFEN 600 MG PO TABS
600.0000 mg | ORAL_TABLET | Freq: Four times a day (QID) | ORAL | Status: DC
  Administered 2012-02-09 – 2012-02-11 (×8): 600 mg via ORAL
  Filled 2012-02-09 (×8): qty 1

## 2012-02-09 MED ORDER — TETANUS-DIPHTH-ACELL PERTUSSIS 5-2.5-18.5 LF-MCG/0.5 IM SUSP
0.5000 mL | Freq: Once | INTRAMUSCULAR | Status: AC
  Administered 2012-02-10: 0.5 mL via INTRAMUSCULAR
  Filled 2012-02-09: qty 0.5

## 2012-02-09 MED ORDER — LIDOCAINE HCL (PF) 1 % IJ SOLN
INTRAMUSCULAR | Status: DC | PRN
  Administered 2012-02-09 (×2): 5 mL

## 2012-02-09 MED ORDER — LANOLIN HYDROUS EX OINT: TOPICAL_OINTMENT | CUTANEOUS | Status: DC | PRN

## 2012-02-09 MED ORDER — WITCH HAZEL-GLYCERIN EX PADS: 1.0000 "application " | MEDICATED_PAD | CUTANEOUS | Status: DC | PRN

## 2012-02-09 MED ORDER — PRENATAL MULTIVITAMIN CH
1.0000 | ORAL_TABLET | Freq: Every day | ORAL | Status: DC
  Administered 2012-02-10 – 2012-02-11 (×2): 1 via ORAL
  Filled 2012-02-09 (×2): qty 1

## 2012-02-09 MED ORDER — DEXTROSE IN LACTATED RINGERS 5 % IV SOLN: INTRAVENOUS | Status: DC

## 2012-02-09 MED ORDER — BENZOCAINE-MENTHOL 20-0.5 % EX AERO
1.0000 "application " | INHALATION_SPRAY | CUTANEOUS | Status: DC | PRN
  Filled 2012-02-09: qty 56

## 2012-02-09 MED ORDER — MEASLES, MUMPS & RUBELLA VAC ~~LOC~~ INJ: 0.5000 mL | INJECTION | Freq: Once | SUBCUTANEOUS | Status: DC

## 2012-02-09 MED ORDER — SENNOSIDES-DOCUSATE SODIUM 8.6-50 MG PO TABS
2.0000 | ORAL_TABLET | Freq: Every day | ORAL | Status: DC
  Administered 2012-02-10: 2 via ORAL

## 2012-02-09 MED ORDER — SIMETHICONE 80 MG PO CHEW: 80.0000 mg | CHEWABLE_TABLET | ORAL | Status: DC | PRN

## 2012-02-09 NOTE — Progress Notes (Signed)
Shelby Paul is a 31 y.o. [phone] at [redacted]w[redacted]d by ultrasound admitted for rupture of membranes  Subjective: Pt declines epidural at this time... Contractions are more painful...   Objective: BP 113/82  Pulse 70  Temp 98 F (36.7 C) (Oral)  Resp 20  Ht 5\' 4"  (1.626 m)  Wt 77.111 kg (170 lb)  BMI 29.18 kg/m2  LMP 04/24/2011      FHT:  FHR: 140 bpm, variability: moderate,  accelerations:  Present,  decelerations:  Absent UC:   regular, every 2 minutes SVE:   Dilation: 4 Effacement (%): 70 Station: -2 Exam by:: dr Shelby Paul  Labs: Lab Results  Component Value Date   WBC 7.8 02/08/2012   HGB 11.9* 02/08/2012   HCT 35.3* 02/08/2012   MCV 83.6 02/08/2012   PLT 133* 02/08/2012    Assessment / Plan: Augmentation of labor, progressing well  Labor: Progressing on Pitocin currently on 6 mUnits Preeclampsia:  NA Fetal Wellbeing:  Category I Pain Control:  Stadol I/D:  ampicillin and gentamicin for prolonged rupture  Anticipated MOD:  NSVD  Shelby Paul. 02/09/2012, 12:12 AM

## 2012-02-09 NOTE — Anesthesia Preprocedure Evaluation (Signed)

## 2012-02-09 NOTE — Progress Notes (Signed)
Paged dr Richardson Dopp, update on pt status, fhr tracing, uc pattern, sve. No new orders received.

## 2012-02-09 NOTE — Anesthesia Procedure Notes (Signed)
Epidural Patient location during procedure: OB Start time: 02/09/2012 12:51 AM  Staffing Anesthesiologist: Angus Seller., Harrell Gave. Performed by: anesthesiologist   Preanesthetic Checklist Completed: patient identified, site marked, surgical consent, pre-op evaluation, timeout performed, IV checked, risks and benefits discussed and monitors and equipment checked  Epidural Patient position: sitting Prep: site prepped and draped and DuraPrep Patient monitoring: continuous pulse ox and blood pressure Approach: midline Injection technique: LOR air and LOR saline  Needle:  Needle type: Tuohy  Needle gauge: 17 G Needle length: 9 cm and 9 Needle insertion depth: 5 cm cm Catheter type: closed end flexible Catheter size: 19 Gauge Catheter at skin depth: 10 cm Test dose: negative  Assessment Events: blood not aspirated, injection not painful, no injection resistance, negative IV test and no paresthesia  Additional Notes Patient identified.  Risk benefits discussed including failed block, incomplete pain control, headache, nerve damage, paralysis, blood pressure changes, nausea, vomiting, reactions to medication both toxic or allergic, and postpartum back pain.  Patient expressed understanding and wished to proceed.  All questions were answered.  Sterile technique used throughout procedure and epidural site dressed with sterile barrier dressing. No paresthesia or other complications noted.The patient did not experience any signs of intravascular injection such as tinnitus or metallic taste in mouth nor signs of intrathecal spread such as rapid motor block. Please see nursing notes for vital signs.

## 2012-02-09 NOTE — Progress Notes (Addendum)
Delivery Note At 12:12 PM a viable female was delivered via Vaginal, Spontaneous Delivery (Presentation: Right Occiput Anterior).  APGAR: 8, 9; weight 7 lb and 6 oz .   Placenta status: Intact, Spontaneous.  Cord: 3 vessels with the following complications: None.   Anesthesia: Epidural  Episiotomy: None Lacerations: None Suture Repair: none Est. Blood Loss (mL): 250  Mom to postpartum.  Baby to nursery-stable.  Karolyna Bianchini E 02/09/2012, 1:35 PM

## 2012-02-10 LAB — CBC
HCT: 30.5 % — ABNORMAL LOW (ref 36.0–46.0)
Hemoglobin: 10.2 g/dL — ABNORMAL LOW (ref 12.0–15.0)
MCV: 84.3 fL (ref 78.0–100.0)
RBC: 3.62 MIL/uL — ABNORMAL LOW (ref 3.87–5.11)
RDW: 13.5 % (ref 11.5–15.5)
WBC: 12.9 10*3/uL — ABNORMAL HIGH (ref 4.0–10.5)

## 2012-02-10 MED ORDER — FERROUS SULFATE 325 (65 FE) MG PO TABS
325.0000 mg | ORAL_TABLET | Freq: Two times a day (BID) | ORAL | Status: DC
  Administered 2012-02-10 – 2012-02-11 (×2): 325 mg via ORAL
  Filled 2012-02-10 (×2): qty 1

## 2012-02-10 NOTE — Progress Notes (Signed)
Post Partum Day 1 Subjective: no complaints and tolerating PO  Objective: Blood pressure 115/76, pulse 69, temperature 97.8 F (36.6 C), temperature source Oral, resp. rate 20, height 5\' 4"  (1.626 m), weight 77.111 kg (170 lb), last menstrual period 04/24/2011, SpO2 100.00%, unknown if currently breastfeeding.  Physical Exam:  General: alert, cooperative and no distress Lochia: appropriate Uterine Fundus: firm Episiotomy, laceration : no DVT Evaluation: No evidence of DVT seen on physical exam.   Basename 02/10/12 0545 02/08/12 1705  HGB 10.2* 11.9*  HCT 30.5* 35.3*    Assessment/Plan: Plan for discharge tomorrow Anemia   P: Iron   LOS: 2 days   Shelby Paul E 02/10/2012, 8:01 AM

## 2012-02-10 NOTE — Anesthesia Postprocedure Evaluation (Signed)
Anesthesia Post Note  Patient: Shelby Paul  Procedure(s) Performed: * No procedures listed *  Anesthesia type: Epidural  Patient location: Mother/Baby  Post pain: Pain level controlled  Post assessment: Post-op Vital signs reviewed  Last Vitals:  Filed Vitals:   02/10/12 0344  BP: 115/76  Pulse: 69  Temp: 36.6 C  Resp: 20    Post vital signs: Reviewed  Level of consciousness:alert  Complications: No apparent anesthesia complications

## 2012-02-11 NOTE — Discharge Summary (Signed)
Obstetric Discharge Summary Reason for Admission: onset of labor and rupture of membranes Prenatal Procedures: none Intrapartum Procedures: spontaneous vaginal delivery Postpartum Procedures: none Complications-Operative and Postpartum: none Hemoglobin  Date Value Range Status  02/10/2012 10.2* 12.0 - 15.0 g/dL Final     HCT  Date Value Range Status  02/10/2012 30.5* 36.0 - 46.0 % Final    Physical Exam:  General: pt not seen as transportation issues were setup for her to leave this morning Lochia: reportedly  appropriate Uterine Fundus: not examined Incision: not examined  DVT Evaluation: reportedly no problems  Discharge Diagnoses: Term Pregnancy-delivered  Discharge Information: Date: 02/11/2012 Activity: unrestricted Diet: routine Medications: None Condition: stable Instructions: refer to practice specific booklet Discharge to: home   Newborn Data: Live born female  Birth Weight: 7 lb 6.9 oz (3371 g) APGAR: 8, 9  Home with mother.  Shelby Paul H. 02/11/2012, 10:54 AM

## 2012-02-11 NOTE — Progress Notes (Signed)
UR chart review completed.  

## 2012-02-11 NOTE — Progress Notes (Signed)
Patient ID: Shelby Paul, female   DOB: 07-12-1980, 32 y.o.   MRN: [id number] Pt had to go today secondary to transportation

## 2012-02-18 NOTE — MAU Provider Note (Signed)
Pt admitted for labor management

## 2013-12-11 ENCOUNTER — Encounter (HOSPITAL_COMMUNITY): Payer: Self-pay | Admitting: *Deleted

## 2019-05-29 ENCOUNTER — Encounter (HOSPITAL_COMMUNITY): Payer: Self-pay

## 2019-05-29 ENCOUNTER — Other Ambulatory Visit: Payer: Self-pay

## 2019-05-29 ENCOUNTER — Emergency Department (HOSPITAL_BASED_OUTPATIENT_CLINIC_OR_DEPARTMENT_OTHER): Payer: No Typology Code available for payment source

## 2019-05-29 ENCOUNTER — Emergency Department (HOSPITAL_COMMUNITY)
Admission: EM | Admit: 2019-05-29 | Discharge: 2019-05-29 | Disposition: A | Discharge status: Home / Self Care | Payer: No Typology Code available for payment source | Attending: Emergency Medicine | Admitting: Emergency Medicine

## 2019-05-29 ENCOUNTER — Emergency Department (HOSPITAL_COMMUNITY): Payer: No Typology Code available for payment source

## 2019-05-29 DIAGNOSIS — O26892 Other specified pregnancy related conditions, second trimester: Secondary | ICD-10-CM | POA: Diagnosis present

## 2019-05-29 DIAGNOSIS — M79661 Pain in right lower leg: Secondary | ICD-10-CM | POA: Diagnosis not present

## 2019-05-29 DIAGNOSIS — Z3A14 14 weeks gestation of pregnancy: Secondary | ICD-10-CM | POA: Insufficient documentation

## 2019-05-29 DIAGNOSIS — R52 Pain, unspecified: Secondary | ICD-10-CM

## 2019-05-29 DIAGNOSIS — O4402 Placenta previa specified as without hemorrhage, second trimester: Secondary | ICD-10-CM

## 2019-05-29 DIAGNOSIS — R609 Edema, unspecified: Secondary | ICD-10-CM

## 2019-05-29 DIAGNOSIS — O09522 Supervision of elderly multigravida, second trimester: Secondary | ICD-10-CM | POA: Insufficient documentation

## 2019-05-29 DIAGNOSIS — M7989 Other specified soft tissue disorders: Secondary | ICD-10-CM

## 2019-05-29 DIAGNOSIS — R519 Headache, unspecified: Secondary | ICD-10-CM | POA: Insufficient documentation

## 2019-05-29 DIAGNOSIS — O99891 Other specified diseases and conditions complicating pregnancy: Secondary | ICD-10-CM | POA: Diagnosis not present

## 2019-05-29 DIAGNOSIS — O4422 Partial placenta previa NOS or without hemorrhage, second trimester: Secondary | ICD-10-CM | POA: Diagnosis not present

## 2019-05-29 DIAGNOSIS — M79651 Pain in right thigh: Secondary | ICD-10-CM | POA: Diagnosis not present

## 2019-05-29 LAB — COMPREHENSIVE METABOLIC PANEL
ALT: 20 U/L (ref 0–44)
AST: 17 U/L (ref 15–41)
Albumin: 3.3 g/dL — ABNORMAL LOW (ref 3.5–5.0)
Alkaline Phosphatase: 34 U/L — ABNORMAL LOW (ref 38–126)
Anion gap: 7 (ref 5–15)
BUN: 6 mg/dL (ref 6–20)
CO2: 22 mmol/L (ref 22–32)
Calcium: 8.6 mg/dL — ABNORMAL LOW (ref 8.9–10.3)
Chloride: 104 mmol/L (ref 98–111)
Creatinine, Ser: 0.43 mg/dL — ABNORMAL LOW (ref 0.44–1.00)
GFR calc Af Amer: >60 mL/min (ref >60)
GFR calc non Af Amer: >60 mL/min (ref >60)
Glucose, Bld: 121 mg/dL — ABNORMAL HIGH (ref 70–99)
Potassium: 3.8 mmol/L (ref 3.5–5.1)
Sodium: 133 mmol/L — ABNORMAL LOW (ref 135–145)
Total Bilirubin: 0.4 mg/dL (ref 0.3–1.2)
Total Protein: 6.2 g/dL — ABNORMAL LOW (ref 6.5–8.1)

## 2019-05-29 LAB — CBC WITH DIFFERENTIAL/PLATELET
Abs Immature Granulocytes: 0.01 10*3/uL (ref 0.00–0.07)
Basophils Absolute: 0 10*3/uL (ref 0.0–0.1)
Basophils Relative: 1 %
Eosinophils Absolute: 0.1 10*3/uL (ref 0.0–0.5)
Eosinophils Relative: 3 %
HCT: 34.9 % — ABNORMAL LOW (ref 36.0–46.0)
Hemoglobin: 11.7 g/dL — ABNORMAL LOW (ref 12.0–15.0)
Immature Granulocytes: 0 %
Lymphocytes Relative: 32 %
Lymphs Abs: 1.4 10*3/uL (ref 0.7–4.0)
MCH: 28.7 pg (ref 26.0–34.0)
MCHC: 33.5 g/dL (ref 30.0–36.0)
MCV: 85.5 fL (ref 80.0–100.0)
Monocytes Absolute: 0.4 10*3/uL (ref 0.1–1.0)
Monocytes Relative: 8 %
Neutro Abs: 2.4 10*3/uL (ref 1.7–7.7)
Neutrophils Relative %: 56 %
Platelets: 159 10*3/uL (ref 150–400)
RBC: 4.08 MIL/uL (ref 3.87–5.11)
RDW: 11.6 % (ref 11.5–15.5)
WBC: 4.3 10*3/uL (ref 4.0–10.5)
nRBC: 0 % (ref 0.0–0.2)

## 2019-05-29 LAB — URINALYSIS, ROUTINE W REFLEX MICROSCOPIC
Bilirubin Urine: NEGATIVE
Glucose, UA: NEGATIVE mg/dL
Hgb urine dipstick: NEGATIVE
Ketones, ur: NEGATIVE mg/dL
Leukocytes,Ua: NEGATIVE
Nitrite: NEGATIVE
Protein, ur: NEGATIVE mg/dL
Specific Gravity, Urine: 1.018 (ref 1.005–1.030)
pH: 6 (ref 5.0–8.0)

## 2019-05-29 LAB — HCG, QUANTITATIVE, PREGNANCY: hCG, Beta Chain, Quant, S: 68486 m[IU]/mL — ABNORMAL HIGH (ref <5)

## 2019-05-29 LAB — TYPE AND SCREEN
ABO/RH(D): B POS
Antibody Screen: NEGATIVE

## 2019-05-29 LAB — ABO/RH: ABO/RH(D): B POS

## 2019-05-29 MED ORDER — ACETAMINOPHEN ER 650 MG PO TBCR: 650.0000 mg | EXTENDED_RELEASE_TABLET | Freq: Three times a day (TID) | ORAL | 0 refills | Status: DC | PRN

## 2019-05-29 MED ORDER — SODIUM CHLORIDE 0.9 % IV BOLUS
1000.0000 mL | Freq: Once | INTRAVENOUS | Status: AC
  Administered 2019-05-29: 1000 mL via INTRAVENOUS

## 2019-05-29 NOTE — ED Triage Notes (Signed)
Patient states she is 3 months pregnant.  Patient c/o intermittent lower abdominal pain,but worse last night. Patient also c/o headache since this AM.

## 2019-05-29 NOTE — Progress Notes (Signed)
Lower extremity venous has been completed.   Preliminary results in CV Proc.   Blanch Media 05/29/2019 11:08 AM

## 2019-05-29 NOTE — Discharge Instructions (Addendum)
Work-up indicates that you are about 14 weeks and 1 day pregnant. You were noted to have a condition called placenta previa.  It is important that you follow-up with OB doctors for further management of this condition.  Return to the ER if you start having heavy bleeding.

## 2019-05-29 NOTE — ED Provider Notes (Addendum)
[facility]-EMERGENCY DEPT Provider Note   CSN: [id number] Arrival date & time: 05/29/19  0758     History Chief Complaint  Patient presents with  . Abdominal Pain  . Headache    Shelby Paul is a 39 y.o. female.  HPI     39 year old female comes in a chief complaint of abdominal pain and headache.  She is G5, P4 and is unsure how far along she is with this pregnancy.  She reports coming in primarily to the ER with chief complaint of headache and abdominal pain.  The headache started this morning.  She has been fasting due to Ramadan for the last 3 days.  The headache is frontal and described as 5 out of 10 without any specific evoking, aggravating or relieving factors.  There is no associated neurologic symptoms such as vision change, dizziness, focal numbness or weakness.  There is no history of headaches.  Additionally she also reports abdominal pain.  She has been having off-and-on abdominal pain for the last couple of weeks, however the pain became more intense yesterday.  The pain is located in the lower quadrants and described as sharp pain.  She also reports having some burning with urination for a long time.  She doubts that she is having bloody urine.  She has not seen an OB doctor for this pregnancy.  Finally patient also complains of pain in the right calf and thigh region.  Past Medical History:  Diagnosis Date  . Anxiety   . Depression   . Gallstones 06/2011  . Pancreatitis 06/2011    There are no problems to display for this patient.   History reviewed. No pertinent surgical history.   OB History    Gravida  5   Para  3   Term  2   Preterm  0   AB  1   Living  3     SAB  1   TAB  0   Ectopic  0   Multiple      Live Births  3           Family History  Problem Relation Age of Onset  . Diabetes Mother   . Hypertension Mother   . Diabetes Father     Social History   Tobacco Use  . Smoking status: Never  Smoker  . Smokeless tobacco: Never Used  Substance Use Topics  . Alcohol use: No  . Drug use: No    Home Medications Prior to Admission medications   Medication Sig Start Date End Date Taking? Authorizing Provider  acetaminophen (TYLENOL 8 HOUR) 650 MG CR tablet Take 1 tablet (650 mg total) by mouth every 8 (eight) hours as needed. 05/29/19   Derwood Kaplan, MD    Allergies    Patient has no known allergies.  Review of Systems   Review of Systems  Constitutional: Positive for activity change.  Respiratory: Negative for shortness of breath.   Cardiovascular: Negative for chest pain.  Gastrointestinal: Positive for abdominal pain and nausea. Negative for vomiting.  Genitourinary: Positive for dysuria.  Musculoskeletal: Positive for back pain.  Allergic/Immunologic: Negative for immunocompromised state.  Neurological: Positive for headaches. Negative for dizziness, tremors, seizures, weakness, light-headedness and numbness.  Hematological: Does not bruise/bleed easily.  All other systems reviewed and are negative.   Physical Exam Updated Vital Signs BP 114/83   Pulse 71   Temp 98.2 F (36.8 C) (Oral)   Resp 18   Ht 5'  6" (1.676 m)   Wt 88.9 kg   SpO2 100%   BMI 31.64 kg/m   Physical Exam Vitals and nursing note reviewed.  Constitutional:      Appearance: She is well-developed.  HENT:     Head: Normocephalic and atraumatic.  Eyes:     Extraocular Movements: Extraocular movements intact.     Pupils: Pupils are equal, round, and reactive to light.  Cardiovascular:     Rate and Rhythm: Normal rate.  Pulmonary:     Effort: Pulmonary effort is normal.  Abdominal:     General: Bowel sounds are normal.     Tenderness: There is abdominal tenderness in the right lower quadrant, suprapubic area and left lower quadrant. There is guarding. There is no rebound.  Musculoskeletal:     Cervical back: Normal range of motion and neck supple.  Skin:    General: Skin is warm and  dry.  Neurological:     Mental Status: She is alert and oriented to person, place, and time.     Cranial Nerves: No cranial nerve deficit.     Motor: No weakness.     ED Results / Procedures / Treatments   Labs (all labs ordered are listed, but only abnormal results are displayed) Labs Reviewed  COMPREHENSIVE METABOLIC PANEL - Abnormal; Notable for the following components:      Result Value   Sodium 133 (*)    Glucose, Bld 121 (*)    Creatinine, Ser 0.43 (*)    Calcium 8.6 (*)    Total Protein 6.2 (*)    Albumin 3.3 (*)    Alkaline Phosphatase 34 (*)    All other components within normal limits  CBC WITH DIFFERENTIAL/PLATELET - Abnormal; Notable for the following components:   Hemoglobin 11.7 (*)    HCT 34.9 (*)    All other components within normal limits  HCG, QUANTITATIVE, PREGNANCY - Abnormal; Notable for the following components:   hCG, Beta Chain, Quant, [place] 29,476 (*)    All other components within normal limits  URINE CULTURE  URINALYSIS, ROUTINE W REFLEX MICROSCOPIC  TYPE AND SCREEN  ABO/RH    EKG None  Radiology US OB Limited  Result Date: 05/29/2019 CLINICAL DATA:  Lower quadrant pain. EXAM: LIMITED OBSTETRIC ULTRASOUND AND TRANSVAGINAL OBSTETRIC ULTRASOUND FINDINGS: Number of Fetuses: 1 Heart Rate:  156 bpm Movement: Yes Presentation: Variable Placental Location: Anterior Previa: Complete Amniotic Fluid (Subjective): Normal BPD: 2.41cm 14w  1d MATERNAL FINDINGS: Cervix: Cervix is difficult to visualized but appears slightly open. Heterogeneous echogenic material located at the internal loss which may reflect a small amount of blood products Uterus/Adnexae:  No abnormality visualized. IMPRESSION: Single live intrauterine pregnancy. Complete placenta previa. Heterogeneous echogenic material located at the internal loss which may reflect a small amount of blood products. Short-term ultrasound follow-up may be helpful. This exam is performed on an emergent basis and  does not comprehensively evaluate fetal size, dating, or anatomy; follow-up complete OB US should be considered if further fetal assessment is warranted. Electronically Signed   By: Elige Ko   On: 05/29/2019 13:48   US OB Transvaginal  Result Date: 05/29/2019 CLINICAL DATA:  Lower quadrant pain. EXAM: LIMITED OBSTETRIC ULTRASOUND AND TRANSVAGINAL OBSTETRIC ULTRASOUND FINDINGS: Number of Fetuses: 1 Heart Rate:  156 bpm Movement: Yes Presentation: Variable Placental Location: Anterior Previa: Complete Amniotic Fluid (Subjective): Normal BPD: 2.41cm 14w  1d MATERNAL FINDINGS: Cervix: Cervix is difficult to visualized but appears slightly open. Heterogeneous echogenic material located at  the internal loss which may reflect a small amount of blood products Uterus/Adnexae:  No abnormality visualized. IMPRESSION: Single live intrauterine pregnancy. Complete placenta previa. Heterogeneous echogenic material located at the internal loss which may reflect a small amount of blood products. Short-term ultrasound follow-up may be helpful. This exam is performed on an emergent basis and does not comprehensively evaluate fetal size, dating, or anatomy; follow-up complete OB US should be considered if further fetal assessment is warranted. Electronically Signed   By: Elige Ko   On: 05/29/2019 13:48   VAS [place] LOWER EXTREMITY VENOUS (DVT) (ONLY [facility] & [facility])  Result Date: 05/29/2019  Lower Venous DVTStudy Indications: Swelling, and Edema.  Comparison Study: no prior Performing Technologist: Blanch Media RVS  Examination Guidelines: A complete evaluation includes B-mode imaging, spectral Doppler, color Doppler, and power Doppler as needed of all accessible portions of each vessel. Bilateral testing is considered an integral part of a complete examination. Limited examinations for reoccurring indications may be performed as noted. The reflux portion of the exam is performed with the patient in reverse Trendelenburg.   +---------+---------------+---------+-----------+----------+--------------+ RIGHT    CompressibilityPhasicitySpontaneityPropertiesThrombus Aging +---------+---------------+---------+-----------+----------+--------------+ CFV      Full           Yes      Yes                                 +---------+---------------+---------+-----------+----------+--------------+ SFJ      Full                                                        +---------+---------------+---------+-----------+----------+--------------+ FV Prox  Full                                                        +---------+---------------+---------+-----------+----------+--------------+ FV Mid   Full                                                        +---------+---------------+---------+-----------+----------+--------------+ FV DistalFull                                                        +---------+---------------+---------+-----------+----------+--------------+ PFV      Full                                                        +---------+---------------+---------+-----------+----------+--------------+ POP      Full           Yes      Yes                                 +---------+---------------+---------+-----------+----------+--------------+  PTV      Full                                                        +---------+---------------+---------+-----------+----------+--------------+ PERO     Full                                                        +---------+---------------+---------+-----------+----------+--------------+   +----+---------------+---------+-----------+----------+--------------+ LEFTCompressibilityPhasicitySpontaneityPropertiesThrombus Aging +----+---------------+---------+-----------+----------+--------------+ CFV Full           Yes      Yes                                  +----+---------------+---------+-----------+----------+--------------+     Summary: RIGHT: - There is no evidence of deep vein thrombosis in the lower extremity.  - No cystic structure found in the popliteal fossa.   *See table(s) above for measurements and observations.    Preliminary     Procedures Procedures (including critical care time)  Medications Ordered in ED Medications  sodium chloride 0.9 % bolus 1,000 mL (0 mLs Intravenous Stopped 05/29/19 1238)    ED Course  I have reviewed the triage vital signs and the nursing notes.  Pertinent labs & imaging results that were available during my care of the patient were reviewed by me and considered in my medical decision making (see chart for details).  Clinical Course as of May 28 1400  Mon May 29, 2019  1401 Labs are reassuring DVT study is negative.  Patient reassessed and the results were discussed.  Headache better while in the ER.  Ultrasound shows placenta previa.  Results will be discussed with her and will advise her to follow-up with OB soon as possible.  Strict ER return precautions will be discussed as well.  US OB Transvaginal [AN]    Clinical Course User Index [AN] Varney Biles, MD   MDM Rules/Calculators/A&P                      39 year old G32 P4 female comes in a chief complaint of headache and lower quadrant abdominal pain.  It appears that the 2 symptoms are unrelated.  The headache could be because of dehydration and a result of her fasting.  We will give her some hydration in the ED.  She has no focal neurologic deficits, the pain is not positional, and there is no associated nausea with this headache.  Doubt preeclampsia, venous thrombosis.  We will initiate basic work-up that would include labs and UA which can rule out preeclampsia.  Additionally she is complaining of lower quadrant abdominal pain.  Positive guarding.  The pain does not appear to be cramping.  No vaginal bleeding.  Ultrasound ordered to  evaluate her pregnancy status.  Differential will include ectopic, placenta previa.  Ultrasound DVT ordered for her right lower extremity pain.  Final Clinical Impression(s) / ED Diagnoses Final diagnoses:  Placenta previa antepartum in second trimester    Rx / DC Orders ED Discharge Orders         Ordered    acetaminophen (TYLENOL 8 HOUR)  650 MG CR tablet  Every 8 hours PRN     05/29/19 1400           Derwood KaplanNanavati, Nikolaus Pienta, MD 05/29/19 [id number]    Derwood KaplanNanavati, Sundy Houchins, MD 05/29/19 1402

## 2019-05-30 LAB — URINE CULTURE: Culture: NO GROWTH

## 2019-07-17 DIAGNOSIS — Z3483 Encounter for supervision of other normal pregnancy, third trimester: Secondary | ICD-10-CM | POA: Insufficient documentation

## 2019-07-24 ENCOUNTER — Encounter: Payer: PRIVATE HEALTH INSURANCE | Admitting: Obstetrics & Gynecology

## 2019-08-16 ENCOUNTER — Encounter: Payer: Self-pay | Admitting: Obstetrics and Gynecology

## 2019-08-16 ENCOUNTER — Other Ambulatory Visit (HOSPITAL_COMMUNITY)
Admission: RE | Admit: 2019-08-16 | Discharge: 2019-08-16 | Disposition: A | Discharge status: Home / Self Care | Payer: Medicaid Other | Source: Ambulatory Visit | Attending: Obstetrics & Gynecology | Admitting: Obstetrics & Gynecology

## 2019-08-16 ENCOUNTER — Ambulatory Visit (INDEPENDENT_AMBULATORY_CARE_PROVIDER_SITE_OTHER): Payer: Medicaid Other | Admitting: Obstetrics and Gynecology

## 2019-08-16 ENCOUNTER — Other Ambulatory Visit: Payer: Self-pay

## 2019-08-16 VITALS — BP 105/71 | HR 79 | Wt 195.0 lb

## 2019-08-16 DIAGNOSIS — O0932 Supervision of pregnancy with insufficient antenatal care, second trimester: Secondary | ICD-10-CM

## 2019-08-16 DIAGNOSIS — O4402 Placenta previa specified as without hemorrhage, second trimester: Secondary | ICD-10-CM

## 2019-08-16 DIAGNOSIS — Z3401 Encounter for supervision of normal first pregnancy, first trimester: Secondary | ICD-10-CM | POA: Insufficient documentation

## 2019-08-16 DIAGNOSIS — O26849 Uterine size-date discrepancy, unspecified trimester: Secondary | ICD-10-CM

## 2019-08-16 DIAGNOSIS — Z3A24 24 weeks gestation of pregnancy: Secondary | ICD-10-CM | POA: Diagnosis not present

## 2019-08-16 DIAGNOSIS — O4400 Placenta previa specified as without hemorrhage, unspecified trimester: Secondary | ICD-10-CM

## 2019-08-16 DIAGNOSIS — O0930 Supervision of pregnancy with insufficient antenatal care, unspecified trimester: Secondary | ICD-10-CM

## 2019-08-16 MED ORDER — PREPLUS 27-1 MG PO TABS: 1.0000 | ORAL_TABLET | Freq: Every day | ORAL | 13 refills | Status: DC

## 2019-08-16 MED ORDER — BLOOD PRESSURE MONITOR KIT: 1.0000 | PACK | 0 refills | Status: DC

## 2019-08-16 NOTE — Progress Notes (Signed)
NOB   LMP: 02/26/19  Planned: No per pt   Last pap: 11/2018 WNL   Genetic Screening: Desires   CC: Lower Abdominal pain

## 2019-08-16 NOTE — Progress Notes (Signed)
INITIAL OBSTETRICAL VISIT Patient name: Shelby Paul MRN [id number]  Date of birth: [birth year] Chief Complaint:   Initial Prenatal Visit  History of Present Illness:   Shelby Paul is a 39 y.o. G26P2013 [place] female at [redacted]w[redacted]d by LMP with an Estimated Date of Delivery: 12/03/19 being seen today for her initial obstetrical visit.  Her obstetrical history is significant for placenta previa noted on early U/S; no f/u since then. This is an unplanned pregnancy. She and the father of the baby (FOB) spouse live together with their children. She has a support system that consists of spouse/her mother/father/friends. Today she reports no complaints.   Patient's last menstrual period was 02/26/2019. Last pap 2020. Results were: normal per pt done at [facility] provider in [facility] Review of Systems:   Pertinent items are noted in HPI Denies cramping/contractions, leakage of fluid, vaginal bleeding, abnormal vaginal discharge w/ itching/odor/irritation, headaches, visual changes, shortness of breath, chest pain, abdominal pain, severe nausea/vomiting, or problems with urination or bowel movements unless otherwise stated above.  Pertinent History Reviewed:  Reviewed past medical,surgical, social, obstetrical and family history.  Reviewed problem list, medications and allergies. OB History  Gravida Para Term Preterm AB Living  5 3 2  0 1 3  SAB TAB Ectopic Multiple Live Births  1 0 0   3    # Outcome Date GA Lbr Len/2nd Weight Sex Delivery Anes PTL Lv  5 Current           4 Term 02/09/12 [redacted]w[redacted]d 13:08 / 00:15 7 lb 6.9 oz (3.371 kg) F Vag-Spont EPI  LIV     Birth Comments: caput  3 Term 03/14/05    F Vag-Spont   LIV  2 SAB 12/11/03          1 Para 07/03/03    F Vag-Spont   LIV     Birth Comments: 1st trimester bleeding   Physical Assessment:   Vitals:   08/16/19 1328  BP: 105/71  Pulse: 79  Weight: 195 lb (88.5 kg)  Body mass index is 31.47 kg/m.       Physical  Examination:  General appearance - well appearing, and in no distress  Mental status - alert, oriented to person, place, and time  Psych:  She has a normal mood and affect  Skin - warm and dry, normal color, no suspicious lesions noted  Chest - effort normal, all lung fields clear to auscultation bilaterally  Heart - normal rate and regular rhythm  Abdomen - soft, nontender  Extremities:  No swelling or varicosities noted  Pelvic - VULVA: normal appearing vulva with no masses, tenderness or lesions  VAGINA: normal appearing vagina with normal color and discharge, no lesions.   CERVIX: normal appearing cervix without discharge or lesions - NO digital exam d/t h/o placenta previa  Thin prep pap is not done   FHTs by doppler: 140 bpm  Assessment & Plan:  1) Low-Risk Pregnancy G5P2013 at [redacted]w[redacted]d with an Estimated Date of Delivery: 12/03/19   2) Initial OB visit - Welcomed to practice and introduced self to patient in addition to discussing other advanced practice providers that she may be seeing at this practice - Congratulated patient - Anticipatory guidance on upcoming appointments - Educated on COVID19 and pregnancy and the integration of virtual appointments  - Educated on babyscripts app- patient reports she has not received email, encouraged to look in spam folder and to call office if she still has not received email -  patient verbalizes understanding    3) Encounter for supervision of normal first pregnancy in first trimester - Culture, OB Urine - Genetic Screening - CBC/D/Plt+RPR+Rh+ABO+Rub Ab... - Cervicovaginal ancillary only( [place]) - [place] MFM OB DETAIL +14 WK; Future - Prenatal Vit-Fe Fumarate-FA (PREPLUS) 27-1 MG TABS; Take 1 tablet by mouth daily.  Dispense: 30 tablet; Refill: 13  4) Late prenatal care  5) Placenta previa antepartum - Detailed Comp U/S ordered    Meds:  Meds ordered this encounter  Medications  . Prenatal Vit-Fe Fumarate-FA (PREPLUS) 27-1 MG TABS     Sig: Take 1 tablet by mouth daily.    Dispense:  30 tablet    Refill:  13    Order Specific Question:   Supervising Provider    Answer:   Reva Bores [[id number]]    Initial labs obtained Continue prenatal vitamins Reviewed n/v relief measures and warning s/s to report Reviewed recommended weight gain based on pre-gravid BMI Encouraged well-balanced diet Genetic Screening discussed: ordered Cystic fibrosis, SMA, Fragile X screening discussed ordered The nature of [facility] with multiple MDs and other Advanced Practice Providers was explained to patient; also emphasized that residents, students are part of our team.  Discussed optimized OB schedule and video visits. Advised can have an in-office visit whenever she feels she needs to be seen.  Does not have own BP cuff. BP cuff Rx faxed today. Explained to patient that BP will be mailed to her house. Check BP weekly, let us know if >140/90. Advised to call during normal business hours and there is an after-hours nurse line available.    Follow-up: Return in about 4 weeks (around 09/13/2019) for Return OB 2hr GTT.   Orders Placed This Encounter  Procedures  . Culture, OB Urine  . [place] MFM OB DETAIL +14 WK  . Genetic Screening  . CBC/D/Plt+RPR+Rh+ABO+Rub Ab...    Raelyn Mora MSN, CNM 08/16/2019

## 2019-08-17 LAB — CBC/D/PLT+RPR+RH+ABO+RUB AB...
Antibody Screen: NEGATIVE
Basophils Absolute: 0 10*3/uL (ref 0.0–0.2)
Basos: 1 %
EOS (ABSOLUTE): 0.2 10*3/uL (ref 0.0–0.4)
Eos: 3 %
HCV Ab: <0.1 s/co ratio (ref 0.0–0.9)
HIV Screen 4th Generation wRfx: NONREACTIVE
Hematocrit: 31.5 % — ABNORMAL LOW (ref 34.0–46.6)
Hemoglobin: 10.5 g/dL — ABNORMAL LOW (ref 11.1–15.9)
Hepatitis B Surface Ag: NEGATIVE
Immature Grans (Abs): 0 10*3/uL (ref 0.0–0.1)
Immature Granulocytes: 1 %
Lymphocytes Absolute: 1.5 10*3/uL (ref 0.7–3.1)
Lymphs: 23 %
MCH: 28.7 pg (ref 26.6–33.0)
MCHC: 33.3 g/dL (ref 31.5–35.7)
MCV: 86 fL (ref 79–97)
Monocytes Absolute: 0.5 10*3/uL (ref 0.1–0.9)
Monocytes: 8 %
Neutrophils Absolute: 4.4 10*3/uL (ref 1.4–7.0)
Neutrophils: 64 %
Platelets: 151 10*3/uL (ref 150–450)
RBC: 3.66 x10E6/uL — ABNORMAL LOW (ref 3.77–5.28)
RDW: 13 % (ref 11.7–15.4)
RPR Ser Ql: NONREACTIVE
Rh Factor: POSITIVE
Rubella Antibodies, IGG: 19.4 index (ref >0.99)
WBC: 6.6 10*3/uL (ref 3.4–10.8)

## 2019-08-17 LAB — CERVICOVAGINAL ANCILLARY ONLY
Bacterial Vaginitis (gardnerella): NEGATIVE
Candida Glabrata: NEGATIVE
Candida Vaginitis: NEGATIVE
Chlamydia: NEGATIVE
Comment: NEGATIVE
Comment: NEGATIVE
Comment: NEGATIVE
Comment: NEGATIVE
Comment: NEGATIVE
Comment: NORMAL
Neisseria Gonorrhea: NEGATIVE
Trichomonas: NEGATIVE

## 2019-08-17 LAB — HCV INTERPRETATION

## 2019-08-18 LAB — URINE CULTURE, OB REFLEX: Organism ID, Bacteria: NO GROWTH

## 2019-08-18 LAB — CULTURE, OB URINE

## 2019-08-21 ENCOUNTER — Encounter: Payer: Self-pay | Admitting: Obstetrics and Gynecology

## 2019-08-23 ENCOUNTER — Encounter: Payer: Self-pay | Admitting: Obstetrics and Gynecology

## 2019-08-23 ENCOUNTER — Other Ambulatory Visit: Payer: Self-pay | Admitting: *Deleted

## 2019-08-23 ENCOUNTER — Other Ambulatory Visit: Payer: Self-pay

## 2019-08-23 ENCOUNTER — Ambulatory Visit: Payer: Medicaid Other | Admitting: *Deleted

## 2019-08-23 ENCOUNTER — Ambulatory Visit: Payer: Medicaid Other | Attending: Obstetrics and Gynecology

## 2019-08-23 VITALS — BP 106/69 | HR 85

## 2019-08-23 DIAGNOSIS — O4400 Placenta previa specified as without hemorrhage, unspecified trimester: Secondary | ICD-10-CM

## 2019-08-23 DIAGNOSIS — O09522 Supervision of elderly multigravida, second trimester: Secondary | ICD-10-CM | POA: Insufficient documentation

## 2019-08-23 DIAGNOSIS — O4402 Placenta previa specified as without hemorrhage, second trimester: Secondary | ICD-10-CM | POA: Diagnosis not present

## 2019-08-23 DIAGNOSIS — Z3A25 25 weeks gestation of pregnancy: Secondary | ICD-10-CM

## 2019-08-23 DIAGNOSIS — Z362 Encounter for other antenatal screening follow-up: Secondary | ICD-10-CM

## 2019-08-23 DIAGNOSIS — Z3401 Encounter for supervision of normal first pregnancy, first trimester: Secondary | ICD-10-CM | POA: Diagnosis present

## 2019-08-23 DIAGNOSIS — O0932 Supervision of pregnancy with insufficient antenatal care, second trimester: Secondary | ICD-10-CM

## 2019-08-23 DIAGNOSIS — Z363 Encounter for antenatal screening for malformations: Secondary | ICD-10-CM | POA: Diagnosis not present

## 2019-09-13 ENCOUNTER — Other Ambulatory Visit: Payer: Self-pay

## 2019-09-13 ENCOUNTER — Other Ambulatory Visit: Payer: PRIVATE HEALTH INSURANCE

## 2019-09-13 ENCOUNTER — Ambulatory Visit (INDEPENDENT_AMBULATORY_CARE_PROVIDER_SITE_OTHER): Payer: Medicaid Other | Admitting: Medical

## 2019-09-13 ENCOUNTER — Encounter: Payer: Self-pay | Admitting: Medical

## 2019-09-13 VITALS — BP 114/74 | HR 81 | Wt 201.0 lb

## 2019-09-13 DIAGNOSIS — Z23 Encounter for immunization: Secondary | ICD-10-CM

## 2019-09-13 DIAGNOSIS — N949 Unspecified condition associated with female genital organs and menstrual cycle: Secondary | ICD-10-CM

## 2019-09-13 DIAGNOSIS — Z8639 Personal history of other endocrine, nutritional and metabolic disease: Secondary | ICD-10-CM | POA: Insufficient documentation

## 2019-09-13 DIAGNOSIS — Z3481 Encounter for supervision of other normal pregnancy, first trimester: Secondary | ICD-10-CM | POA: Diagnosis not present

## 2019-09-13 DIAGNOSIS — Z3A28 28 weeks gestation of pregnancy: Secondary | ICD-10-CM

## 2019-09-13 MED ORDER — COMFORT FIT MATERNITY SUPP LG MISC: 1.0000 [IU] | Freq: Every day | 0 refills | Status: DC | PRN

## 2019-09-13 NOTE — Progress Notes (Signed)
   PRENATAL VISIT NOTE  Subjective:  Shelby Paul is a 39 y.o. H7G9021 at [redacted]w[redacted]d being seen today for ongoing prenatal care.  She is currently monitored for the following issues for this low-risk pregnancy and has Supervision of normal pregnancy in first trimester; Late prenatal care; and History of vitamin D deficiency on their problem list.  Patient reports pelvic pain with ambulation.  Contractions: Not present. Vag. Bleeding: None.  Movement: Present. Denies leaking of fluid.   The following portions of the patient's history were reviewed and updated as appropriate: allergies, current medications, past family history, past medical history, past social history, past surgical history and problem list.   Objective:   Vitals:   09/13/19 0850  BP: 114/74  Pulse: 81  Weight: 201 lb (91.2 kg)    Fetal Status: Fetal Heart Rate (bpm): 130   Movement: Present     General:  Alert, oriented and cooperative. Patient is in no acute distress.  Skin: Skin is warm and dry. No rash noted.   Cardiovascular: Normal heart rate noted  Respiratory: Normal respiratory effort, no problems with respiration noted  Abdomen: Soft, gravid, appropriate for gestational age.  Pain/Pressure: Present     Pelvic: Cervical exam deferred        Extremities: Normal range of motion.  Edema: None  Mental Status: Normal mood and affect. Normal behavior. Normal judgment and thought content.   Assessment and Plan:  Pregnancy: [id number] at [redacted]w[redacted]d 1. Encounter for supervision of other normal pregnancy in first trimester - Tdap vaccine greater than or equal to 7yo IM - Glucose Tolerance, 2 Hours w/1 Hour - CBC - RPR - HIV Antibody (routine testing w rflx) - MOC still undecided  - F/U US 8/11 to complete anatomy  2. Round ligament pain - Elastic Bandages & Supports (COMFORT FIT MATERNITY SUPP LG) MISC; 1 Units by Does not apply route daily as needed.  Dispense: 1 each; Refill: 0 - Patient given information for [employer]     3. History of vitamin D deficiency - Patient states history with previous pregnancy and is requesting lab today  - Vitamin D (25 hydroxy)  4. [redacted] weeks gestation of pregnancy  Preterm labor symptoms and general obstetric precautions including but not limited to vaginal bleeding, contractions, leaking of fluid and fetal movement were reviewed in detail with the patient. Please refer to After Visit Summary for other counseling recommendations.   Return in about 2 weeks (around 09/27/2019) for LOB, In-Person.  Future Appointments  Date Time Provider Department Center  09/20/2019  8:30 AM [facility] NURSE [facility]  09/20/2019  8:45 AM [facility] US4 [facility]    Vonzella Nipple, PA-C

## 2019-09-13 NOTE — Patient Instructions (Addendum)
Take prescription for belly band:  [facility]   [address] [place], [place], [place] [address] Phone: [phone]  Monday     8:30AM-5PM Tuesday 8:30AM-5PM Wednesday 8:30AM-5PM Thursday 8:30AM-5PM Friday  8:30AM-5PM Saturday Closed Sunday Closed     Fetal Movement Counts Patient Name: ________________________________________________ Patient Due Date: ____________________ What is a fetal movement count?  A fetal movement count is the number of times that you feel your baby move during a certain amount of time. This may also be called a fetal kick count. A fetal movement count is recommended for every pregnant woman. You may be asked to start counting fetal movements as early as week 28 of your pregnancy. Pay attention to when your baby is most active. You may notice your baby's sleep and wake cycles. You may also notice things that make your baby move more. You should do a fetal movement count:  When your baby is normally most active.  At the same time each day. A good time to count movements is while you are resting, after having something to eat and drink. How do I count fetal movements? 1. Find a quiet, comfortable area. Sit, or lie down on your side. 2. Write down the date, the start time and stop time, and the number of movements that you felt between those two times. Take this information with you to your health care visits. 3. Write down your start time when you feel the first movement. 4. Count kicks, flutters, swishes, rolls, and jabs. You should feel at least 10 movements. 5. You may stop counting after you have felt 10 movements, or if you have been counting for 2 hours. Write down the stop time. 6. If you do not feel 10 movements in 2 hours, contact your health care provider for further instructions. Your health care provider may want to do additional tests to assess your baby's well-being. Contact a health care provider if:  You feel fewer than 10  movements in 2 hours.  Your baby is not moving like he or she usually does. Date: ____________ Start time: ____________ Stop time: ____________ Movements: ____________ Date: ____________ Start time: ____________ Stop time: ____________ Movements: ____________ Date: ____________ Start time: ____________ Stop time: ____________ Movements: ____________ Date: ____________ Start time: ____________ Stop time: ____________ Movements: ____________ Date: ____________ Start time: ____________ Stop time: ____________ Movements: ____________ Date: ____________ Start time: ____________ Stop time: ____________ Movements: ____________ Date: ____________ Start time: ____________ Stop time: ____________ Movements: ____________ Date: ____________ Start time: ____________ Stop time: ____________ Movements: ____________ Date: ____________ Start time: ____________ Stop time: ____________ Movements: ____________ This information is not intended to replace advice given to you by your health care provider. Make sure you discuss any questions you have with your health care provider. Document Revised: 09/15/2018 Document Reviewed: 09/15/2018 Elsevier Patient Education  2020 [employer] of the uterus can occur throughout pregnancy, but they are not always a sign that you are in labor. You may have practice contractions called Braxton Hicks contractions. These false labor contractions are sometimes confused with true labor. What are Deberah Pelton contractions? Braxton Hicks contractions are tightening movements that occur in the muscles of the uterus before labor. Unlike true labor contractions, these contractions do not result in opening (dilation) and thinning of the cervix. Toward the end of pregnancy (32-34 weeks), Braxton Hicks contractions can happen more often and may become stronger. These contractions are sometimes difficult to tell apart from true labor because they can  be  very uncomfortable. You should not feel embarrassed if you go to the hospital with false labor. Sometimes, the only way to tell if you are in true labor is for your health care provider to look for changes in the cervix. The health care provider will do a physical exam and may monitor your contractions. If you are not in true labor, the exam should show that your cervix is not dilating and your water has not broken. If there are no other health problems associated with your pregnancy, it is completely safe for you to be sent home with false labor. You may continue to have Braxton Hicks contractions until you go into true labor. How to tell the difference between true labor and false labor True labor  Contractions last 30-70 seconds.  Contractions become very regular.  Discomfort is usually felt in the top of the uterus, and it spreads to the lower abdomen and low back.  Contractions do not go away with walking.  Contractions usually become more intense and increase in frequency.  The cervix dilates and gets thinner. False labor  Contractions are usually shorter and not as strong as true labor contractions.  Contractions are usually irregular.  Contractions are often felt in the front of the lower abdomen and in the groin.  Contractions may go away when you walk around or change positions while lying down.  Contractions get weaker and are shorter-lasting as time goes on.  The cervix usually does not dilate or become thin. Follow these instructions at home:   Take over-the-counter and prescription medicines only as told by your health care provider.  Keep up with your usual exercises and follow other instructions from your health care provider.  Eat and drink lightly if you think you are going into labor.  If Braxton Hicks contractions are making you uncomfortable: ? Change your position from lying down or resting to walking, or change from walking to resting. ? Sit and rest in  a tub of warm water. ? Drink enough fluid to keep your urine pale yellow. Dehydration may cause these contractions. ? Do slow and deep breathing several times an hour.  Keep all follow-up prenatal visits as told by your health care provider. This is important. Contact a health care provider if:  You have a fever.  You have continuous pain in your abdomen. Get help right away if:  Your contractions become stronger, more regular, and closer together.  You have fluid leaking or gushing from your vagina.  You pass blood-tinged mucus (bloody show).  You have bleeding from your vagina.  You have low back pain that you never had before.  You feel your baby's head pushing down and causing pelvic pressure.  Your baby is not moving inside you as much as it used to. Summary  Contractions that occur before labor are called Braxton Hicks contractions, false labor, or practice contractions.  Braxton Hicks contractions are usually shorter, weaker, farther apart, and less regular than true labor contractions. True labor contractions usually become progressively stronger and regular, and they become more frequent.  Manage discomfort from [facility] contractions by changing position, resting in a warm bath, drinking plenty of water, or practicing deep breathing. This information is not intended to replace advice given to you by your health care provider. Make sure you discuss any questions you have with your health care provider. Document Revised: 01/08/2017 Document Reviewed: 06/11/2016 Elsevier Patient Education  2020 [employer].

## 2019-09-13 NOTE — Progress Notes (Signed)
Pt presents for ROB and 2 gtt labs. Pt requests to review lab results Tdap given today LD without difficulty

## 2019-09-14 ENCOUNTER — Encounter: Payer: Self-pay | Admitting: Medical

## 2019-09-14 ENCOUNTER — Telehealth: Payer: Self-pay

## 2019-09-14 ENCOUNTER — Other Ambulatory Visit: Payer: Self-pay

## 2019-09-14 DIAGNOSIS — O24419 Gestational diabetes mellitus in pregnancy, unspecified control: Secondary | ICD-10-CM | POA: Insufficient documentation

## 2019-09-14 LAB — RPR: RPR Ser Ql: NONREACTIVE

## 2019-09-14 LAB — CBC
Hematocrit: 33.6 % — ABNORMAL LOW (ref 34.0–46.6)
Hemoglobin: 11.2 g/dL (ref 11.1–15.9)
MCH: 29.2 pg (ref 26.6–33.0)
MCHC: 33.3 g/dL (ref 31.5–35.7)
MCV: 88 fL (ref 79–97)
Platelets: 150 10*3/uL (ref 150–450)
RBC: 3.84 x10E6/uL (ref 3.77–5.28)
RDW: 12.6 % (ref 11.7–15.4)
WBC: 7.3 10*3/uL (ref 3.4–10.8)

## 2019-09-14 LAB — GLUCOSE TOLERANCE, 2 HOURS W/ 1HR
Glucose, 1 hour: 158 mg/dL (ref 65–179)
Glucose, 2 hour: 160 mg/dL — ABNORMAL HIGH (ref 65–152)
Glucose, Fasting: 81 mg/dL (ref 65–91)

## 2019-09-14 LAB — VITAMIN D 25 HYDROXY (VIT D DEFICIENCY, FRACTURES): Vit D, 25-Hydroxy: 18.3 ng/mL — ABNORMAL LOW (ref 30.0–100.0)

## 2019-09-14 LAB — HIV ANTIBODY (ROUTINE TESTING W REFLEX): HIV Screen 4th Generation wRfx: NONREACTIVE

## 2019-09-14 MED ORDER — ACCU-CHEK GUIDE VI STRP: ORAL_STRIP | 12 refills | Status: DC

## 2019-09-14 MED ORDER — VITAMIN D 25 MCG (1000 UNIT) PO TABS: 1000.0000 [IU] | ORAL_TABLET | Freq: Every day | ORAL | 1 refills | Status: DC

## 2019-09-14 MED ORDER — ACCU-CHEK GUIDE W/DEVICE KIT: 1.0000 | PACK | Freq: Four times a day (QID) | 0 refills | Status: DC

## 2019-09-14 MED ORDER — ACCU-CHEK SOFTCLIX LANCETS MISC: 1.0000 | Freq: Four times a day (QID) | 12 refills | Status: DC

## 2019-09-14 NOTE — Telephone Encounter (Signed)
-----   Message from Marny Lowenstein, PA-C sent at 09/14/2019 12:48 PM EDT ----- Patient failed 2 hour. Needs diabetic education and supplies. She does not have MyChart so please let her know the results. Also, her vitamin D is low again. I have sent Rx for supplement.   Vonzella Nipple, PA-C 09/14/2019 12:43 PM

## 2019-09-14 NOTE — Telephone Encounter (Signed)
TC to pt regarding GTT results  No answer LVM.

## 2019-09-14 NOTE — Addendum Note (Signed)
Addended by: Marny Lowenstein on: 09/14/2019 12:48 PM   Modules accepted: Orders

## 2019-09-20 ENCOUNTER — Ambulatory Visit: Payer: Medicaid Other | Attending: Obstetrics and Gynecology

## 2019-09-20 ENCOUNTER — Other Ambulatory Visit: Payer: Self-pay

## 2019-09-20 ENCOUNTER — Other Ambulatory Visit: Payer: Self-pay | Admitting: *Deleted

## 2019-09-20 ENCOUNTER — Ambulatory Visit: Payer: Medicaid Other | Admitting: *Deleted

## 2019-09-20 VITALS — BP 106/76 | HR 71

## 2019-09-20 DIAGNOSIS — O24419 Gestational diabetes mellitus in pregnancy, unspecified control: Secondary | ICD-10-CM

## 2019-09-20 DIAGNOSIS — O09523 Supervision of elderly multigravida, third trimester: Secondary | ICD-10-CM

## 2019-09-20 DIAGNOSIS — O09529 Supervision of elderly multigravida, unspecified trimester: Secondary | ICD-10-CM

## 2019-09-20 DIAGNOSIS — Z362 Encounter for other antenatal screening follow-up: Secondary | ICD-10-CM

## 2019-09-20 DIAGNOSIS — Z363 Encounter for antenatal screening for malformations: Secondary | ICD-10-CM

## 2019-09-20 DIAGNOSIS — Z3A29 29 weeks gestation of pregnancy: Secondary | ICD-10-CM

## 2019-09-20 DIAGNOSIS — O0933 Supervision of pregnancy with insufficient antenatal care, third trimester: Secondary | ICD-10-CM | POA: Diagnosis not present

## 2019-09-27 ENCOUNTER — Other Ambulatory Visit: Payer: Self-pay

## 2019-09-27 ENCOUNTER — Ambulatory Visit (INDEPENDENT_AMBULATORY_CARE_PROVIDER_SITE_OTHER): Payer: Medicaid Other | Admitting: Nurse Practitioner

## 2019-09-27 VITALS — BP 107/73 | HR 83 | Wt 199.2 lb

## 2019-09-27 DIAGNOSIS — Z3A30 30 weeks gestation of pregnancy: Secondary | ICD-10-CM

## 2019-09-27 DIAGNOSIS — Z3481 Encounter for supervision of other normal pregnancy, first trimester: Secondary | ICD-10-CM

## 2019-09-27 DIAGNOSIS — O24419 Gestational diabetes mellitus in pregnancy, unspecified control: Secondary | ICD-10-CM

## 2019-09-27 NOTE — Progress Notes (Signed)
Pt. Presents for ROB Pt. States she has not been taking blood sugars or blood pressures at home

## 2019-09-27 NOTE — Progress Notes (Signed)
    Subjective:  Shelby Paul is a 39 y.o. [id number] at [redacted]w[redacted]d being seen today for ongoing prenatal care.  She is currently monitored for the following issues for this high-risk pregnancy and has Supervision of normal pregnancy in first trimester; Late prenatal care; History of vitamin D deficiency; and Gestational diabetes mellitus (GDM) in third trimester on their problem list.  Patient reports pain in left leg when sleeping.  Contractions: Not present. Vag. Bleeding: None.  Movement: Present. Denies leaking of fluid.   The following portions of the patient's history were reviewed and updated as appropriate: allergies, current medications, past family history, past medical history, past social history, past surgical history and problem list. Problem list updated.  Objective:   Vitals:   09/27/19 0953  BP: 107/73  Pulse: 83  Weight: 199 lb 3.2 oz (90.4 kg)    Fetal Status: Fetal Heart Rate (bpm): 141   Movement: Present     General:  Alert, oriented and cooperative. Patient is in no acute distress.  Skin: Skin is warm and dry. No rash noted.   Cardiovascular: Normal heart rate noted  Respiratory: Normal respiratory effort, no problems with respiration noted  Abdomen: Soft, gravid, appropriate for gestational age. Pain/Pressure: Present     Pelvic:  Cervical exam deferred        Extremities: Normal range of motion.  Edema: None  Mental Status: Normal mood and affect. Normal behavior. Normal judgment and thought content.   Urinalysis:      Assessment and Plan:  Pregnancy: [id number] at [redacted]w[redacted]d  1. Encounter for supervision of other High risk pregnancy in first trimester Has [place] for growth scheduled Client worried as she is losing weight but weigh loss is minimal and did not eat before her visit today.  2. Gestational diabetes mellitus (GDM), antepartum, gestational diabetes method of control unspecified Not checking sugars - has not attended diabetes class due to scheduling issues -  advised to call to reschedule Checking CBG today - Fasting 86 Client dies not think she has GDM Encouraged to see diabetes educator and check blood sugars at home  3.  Pain in left leg when sleeping Advised to use pillow at night under her uterus when on left side.  Preterm labor symptoms and general obstetric precautions including but not limited to vaginal bleeding, contractions, leaking of fluid and fetal movement were reviewed in detail with the patient. Please refer to After Visit Summary for other counseling recommendations.  Return in about 2 weeks (around 10/11/2019) for MD for [facility].  Nolene Bernheim, RN, MSN, NP-BC Nurse Practitioner, [facility] for [employer], [facility] 09/27/2019 10:47 AM

## 2019-10-10 ENCOUNTER — Other Ambulatory Visit: Payer: Self-pay

## 2019-10-10 ENCOUNTER — Encounter: Payer: Self-pay | Admitting: Obstetrics and Gynecology

## 2019-10-10 ENCOUNTER — Ambulatory Visit (INDEPENDENT_AMBULATORY_CARE_PROVIDER_SITE_OTHER): Payer: Medicaid Other | Admitting: Obstetrics and Gynecology

## 2019-10-10 VITALS — BP 109/75 | HR 89 | Wt 198.9 lb

## 2019-10-10 DIAGNOSIS — O0993 Supervision of high risk pregnancy, unspecified, third trimester: Secondary | ICD-10-CM

## 2019-10-10 DIAGNOSIS — Z8639 Personal history of other endocrine, nutritional and metabolic disease: Secondary | ICD-10-CM

## 2019-10-10 DIAGNOSIS — O24419 Gestational diabetes mellitus in pregnancy, unspecified control: Secondary | ICD-10-CM

## 2019-10-10 NOTE — Progress Notes (Signed)
Subjective:  Shelby Paul is a 39 y.o. [id number] at [redacted]w[redacted]d being seen today for ongoing prenatal care.  She is currently monitored for the following issues for this high-risk pregnancy and has Supervision of normal pregnancy in first trimester; Late prenatal care; History of vitamin D deficiency; and Gestational diabetes mellitus (GDM) in third trimester on their problem list.  GDM: Patient taking nothing.  Reports no hypoglycemic episodes.  Tolerating medication well Fasting: unknown 2hr PP: unknown  The patient is scheduled with the diabetic educator tomorrow.  She has not been checking her glucose levels because she is unsure of what she is doing.  Patient reports shooting pain down the front and back of her left leg.  .  Contractions: Not present. Vag. Bleeding: None.  Movement: Present. Denies leaking of fluid.   The following portions of the patient's history were reviewed and updated as appropriate: allergies, current medications, past family history, past medical history, past social history, past surgical history and problem list. Problem list updated.  Objective:   Vitals:   10/10/19 1021  BP: 109/75  Pulse: 89  Weight: 198 lb 14.4 oz (90.2 kg)    Fetal Status: Fetal Heart Rate (bpm): 154 Fundal Height: 34 cm Movement: Present     General:  Alert, oriented and cooperative. Patient is in no acute distress.  Skin: Skin is warm and dry. No rash noted.   Cardiovascular: Normal heart rate noted  Respiratory: Normal respiratory effort, no problems with respiration noted  Abdomen: Soft, gravid, appropriate for gestational age. Pain/Pressure: Present     Pelvic: Vag. Bleeding: None     Cervical exam deferred        Extremities: Normal range of motion.  Edema: None  No calf tenderness  Mental Status: Normal mood and affect. Normal behavior. Normal judgment and thought content.   Urinalysis:      Assessment and Plan:  Pregnancy: [id number] at 102w2d  1. Supervision of high risk  pregnancy in third trimester   2. Gestational diabetes mellitus (GDM), antepartum, gestational diabetes method of control unspecified - Patient strongly encouraged to keep appointment with diabetic educator. - She is scheduled for serial growth scans. - Will initiate antenatal testing since glucose levels are unkown.  3. History of vitamin D deficiency - Patient to continue Vit D3   Preterm labor symptoms and general obstetric precautions including but not limited to vaginal bleeding, contractions, leaking of fluid and fetal movement were reviewed in detail with the patient. Please refer to After Visit Summary for other counseling recommendations.  Return in about 2 weeks (around 10/24/2019) for HROB.   Johnny Bridge, MD

## 2019-10-10 NOTE — Progress Notes (Signed)
HROB,  Pt reports that she is not really checking her BS.

## 2019-10-11 ENCOUNTER — Other Ambulatory Visit: Payer: Self-pay

## 2019-10-11 ENCOUNTER — Encounter: Payer: Medicaid Other | Attending: Medical | Admitting: Registered"

## 2019-10-11 DIAGNOSIS — O24419 Gestational diabetes mellitus in pregnancy, unspecified control: Secondary | ICD-10-CM

## 2019-10-12 ENCOUNTER — Encounter: Payer: Self-pay | Admitting: Registered"

## 2019-10-12 NOTE — Progress Notes (Signed)
Patient was seen on 10/11/19 for Gestational Diabetes self-management class at [facility]. The following learning objectives were met by the patient during this course:   States the definition of Gestational Diabetes  States why dietary management is important in controlling blood glucose  Describes the effects each nutrient has on blood glucose levels  Demonstrates ability to create a balanced meal plan  Demonstrates carbohydrate counting   States when to check blood glucose levels  Demonstrates proper blood glucose monitoring techniques  States the effect of stress and exercise on blood glucose levels  States the importance of limiting caffeine and abstaining from alcohol and smoking  Blood glucose monitor given: Patient brought meter to class Lot # n/a Exp: n/a Blood glucose reading: 72 mg/dL  Patient instructed to monitor glucose levels: FBS: 60 - <95; 1 hour: <140; 2 hour: <120  Patient received handouts:  Nutrition Diabetes and Pregnancy, including carb counting list  Patient will be seen for follow-up as needed.

## 2019-10-18 ENCOUNTER — Ambulatory Visit: Payer: Medicaid Other

## 2019-10-18 ENCOUNTER — Other Ambulatory Visit: Payer: Self-pay

## 2019-10-18 ENCOUNTER — Ambulatory Visit: Payer: Medicaid Other | Admitting: *Deleted

## 2019-10-18 ENCOUNTER — Encounter: Payer: Self-pay | Admitting: *Deleted

## 2019-10-18 VITALS — BP 121/75 | HR 89

## 2019-10-24 ENCOUNTER — Ambulatory Visit (INDEPENDENT_AMBULATORY_CARE_PROVIDER_SITE_OTHER): Payer: Medicaid Other | Admitting: Family Medicine

## 2019-10-24 ENCOUNTER — Ambulatory Visit (HOSPITAL_BASED_OUTPATIENT_CLINIC_OR_DEPARTMENT_OTHER): Payer: Medicaid Other

## 2019-10-24 ENCOUNTER — Encounter: Payer: Self-pay | Admitting: Family Medicine

## 2019-10-24 ENCOUNTER — Other Ambulatory Visit: Payer: Self-pay | Admitting: *Deleted

## 2019-10-24 ENCOUNTER — Other Ambulatory Visit: Payer: Self-pay

## 2019-10-24 ENCOUNTER — Ambulatory Visit: Payer: Medicaid Other | Admitting: *Deleted

## 2019-10-24 ENCOUNTER — Telehealth: Payer: Self-pay

## 2019-10-24 ENCOUNTER — Ambulatory Visit (HOSPITAL_COMMUNITY)
Admission: RE | Admit: 2019-10-24 | Discharge: 2019-10-24 | Disposition: A | Discharge status: Home / Self Care | Payer: Medicaid Other | Source: Ambulatory Visit | Attending: Family Medicine | Admitting: Family Medicine

## 2019-10-24 ENCOUNTER — Encounter: Payer: Self-pay | Admitting: *Deleted

## 2019-10-24 VITALS — BP 116/73 | HR 85 | Wt 201.0 lb

## 2019-10-24 DIAGNOSIS — Z3401 Encounter for supervision of normal first pregnancy, first trimester: Secondary | ICD-10-CM | POA: Diagnosis not present

## 2019-10-24 DIAGNOSIS — O24419 Gestational diabetes mellitus in pregnancy, unspecified control: Secondary | ICD-10-CM | POA: Insufficient documentation

## 2019-10-24 DIAGNOSIS — M79605 Pain in left leg: Secondary | ICD-10-CM | POA: Insufficient documentation

## 2019-10-24 DIAGNOSIS — O0933 Supervision of pregnancy with insufficient antenatal care, third trimester: Secondary | ICD-10-CM

## 2019-10-24 DIAGNOSIS — Z23 Encounter for immunization: Secondary | ICD-10-CM

## 2019-10-24 DIAGNOSIS — O09523 Supervision of elderly multigravida, third trimester: Secondary | ICD-10-CM | POA: Diagnosis not present

## 2019-10-24 DIAGNOSIS — O2203 Varicose veins of lower extremity in pregnancy, third trimester: Secondary | ICD-10-CM | POA: Diagnosis not present

## 2019-10-24 DIAGNOSIS — M79604 Pain in right leg: Secondary | ICD-10-CM | POA: Diagnosis not present

## 2019-10-24 DIAGNOSIS — O24410 Gestational diabetes mellitus in pregnancy, diet controlled: Secondary | ICD-10-CM

## 2019-10-24 DIAGNOSIS — Z3A34 34 weeks gestation of pregnancy: Secondary | ICD-10-CM | POA: Diagnosis not present

## 2019-10-24 NOTE — Telephone Encounter (Signed)
Call patient to inform her of negative dopplers. I have left a detail message explaining this to her.

## 2019-10-24 NOTE — Telephone Encounter (Signed)
-----   Message from Reva Bores, MD sent at 10/24/2019  4:45 PM EDT ----- Negative dopplers--please alert pt.

## 2019-10-24 NOTE — Progress Notes (Signed)
Patient reports fetal movement with occasional back pain. Pt reports last BG from last night was 78.

## 2019-10-24 NOTE — Patient Instructions (Signed)

## 2019-10-24 NOTE — Progress Notes (Signed)
   PRENATAL VISIT NOTE  Subjective:  Shelby Paul is a 39 y.o. [id number] at [redacted]w[redacted]d being seen today for ongoing prenatal care.  She is currently monitored for the following issues for this low-risk pregnancy and has Supervision of normal pregnancy in first trimester; Late prenatal care; History of vitamin D deficiency; and Gestational diabetes mellitus (GDM) in third trimester on their problem list.  Patient reports pelvic pain and LE pain L>R behind knee.  Contractions: Not present. Vag. Bleeding: None.  Movement: Present. Denies leaking of fluid.   The following portions of the patient's history were reviewed and updated as appropriate: allergies, current medications, past family history, past medical history, past social history, past surgical history and problem list.   Objective:   Vitals:   10/24/19 0935  BP: 116/73  Pulse: 85  Weight: 201 lb (91.2 kg)    Fetal Status: Fetal Heart Rate (bpm): 142 Fundal Height: 34 cm Movement: Present     General:  Alert, oriented and cooperative. Patient is in no acute distress.  Skin: Skin is warm and dry. No rash noted.   Cardiovascular: Normal heart rate noted  Respiratory: Normal respiratory effort, no problems with respiration noted  Abdomen: Soft, gravid, appropriate for gestational age.  Pain/Pressure: Present     Pelvic: Cervical exam deferred        Extremities: Normal range of motion.  Edema: None Negative Homan's  Mental Status: Normal mood and affect. Normal behavior. Normal judgment and thought content.   Assessment and Plan:  Pregnancy: [id number] at [redacted]w[redacted]d 1. Diet controlled gestational diabetes mellitus (GDM) in third trimester No book, per her report 16, 80, 117 is highest, most are under 100  2. Encounter for supervision of normal pregnancy in third trimester Continue routine prenatal care. - Babyscripts Schedule Optimization - Flu Vaccine QUAD 36+ mos IM (Fluarix, Quad PF)  3. Bilateral leg pain Check Dopplers - VAS [place]  LOWER EXTREMITY VENOUS (DVT); Future  4. 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 The patient's questions and concerns were addressed today, including safety of the vaccination and potential side effects as they have been published by ACOG and SMFM. The patient has been informed that there have not been any documented vaccine related injuries, deaths or birth defects to infant or mom after receiving the COVID-19 vaccine to date. The patient has been made aware that although she is not at increased risk of contracting COVID-19 during pregnancy, she is at increased risk of developing severe disease and complications if she contracts COVID-19 while pregnant. All patient questions were addressed during our visit today. The patient is still unsure of her decision for vaccination.   Preterm labor symptoms and general obstetric precautions including but not limited to vaginal bleeding, contractions, leaking of fluid and fetal movement were reviewed in detail with the patient. Please refer to After Visit Summary for other counseling recommendations.   Return in 2 weeks (on 11/07/2019) for [facility], in person.  Future Appointments  Date Time Provider Department Center  10/24/2019  4:00 PM [facility] - [facility]  11/07/2019  3:15 PM Johnny Bridge, MD [facility] None  11/21/2019 11:00 AM [facility] US1 [facility]    Reva Bores, MD

## 2019-11-07 ENCOUNTER — Encounter: Payer: Self-pay | Admitting: Obstetrics and Gynecology

## 2019-11-07 ENCOUNTER — Ambulatory Visit (INDEPENDENT_AMBULATORY_CARE_PROVIDER_SITE_OTHER): Payer: Medicaid Other | Admitting: Obstetrics and Gynecology

## 2019-11-07 ENCOUNTER — Encounter: Payer: Self-pay | Admitting: Obstetrics

## 2019-11-07 ENCOUNTER — Other Ambulatory Visit (HOSPITAL_COMMUNITY)
Admission: RE | Admit: 2019-11-07 | Discharge: 2019-11-07 | Disposition: A | Discharge status: Home / Self Care | Payer: Medicaid Other | Source: Ambulatory Visit | Attending: Obstetrics and Gynecology | Admitting: Obstetrics and Gynecology

## 2019-11-07 ENCOUNTER — Other Ambulatory Visit: Payer: Self-pay

## 2019-11-07 VITALS — BP 121/77 | HR 69 | Wt 207.1 lb

## 2019-11-07 DIAGNOSIS — O0993 Supervision of high risk pregnancy, unspecified, third trimester: Secondary | ICD-10-CM | POA: Diagnosis present

## 2019-11-07 DIAGNOSIS — Z8639 Personal history of other endocrine, nutritional and metabolic disease: Secondary | ICD-10-CM

## 2019-11-07 DIAGNOSIS — O24410 Gestational diabetes mellitus in pregnancy, diet controlled: Secondary | ICD-10-CM

## 2019-11-07 NOTE — Progress Notes (Signed)
   PRENATAL VISIT NOTE  Subjective:  Shelby Paul is a 39 y.o. [id number] at [redacted]w[redacted]d being seen today for ongoing prenatal care.  She is currently monitored for the following issues for this high-risk pregnancy and has Supervision of normal pregnancy in first trimester; Late prenatal care; History of vitamin D deficiency; and Gestational diabetes mellitus (GDM) in third trimester on their problem list.   The patient did not bring in her glucose log.  She states that her fastings are in the 38s - 80s.  The 2 hour after meals are no higher than 117.  Patient reports no complaints.  Contractions: Not present. Vag. Bleeding: None.  Movement: Present. Denies leaking of fluid.   The following portions of the patient's history were reviewed and updated as appropriate: allergies, current medications, past family history, past medical history, past social history, past surgical history and problem list.   Objective:   Vitals:   11/07/19 1532  BP: 121/77  Pulse: 69  Weight: 207 lb 1.6 oz (93.9 kg)    Fetal Status: Fetal Heart Rate (bpm): 154 Fundal Height: 37 cm Movement: Present  Presentation: Vertex  General:  Alert, oriented and cooperative. Patient is in no acute distress.  Skin: Skin is warm and dry. No rash noted.   Cardiovascular: Normal heart rate noted  Respiratory: Normal respiratory effort, no problems with respiration noted  Abdomen: Soft, gravid, appropriate for gestational age.  Pain/Pressure: Present     Pelvic: Cervical exam deferred        Extremities: Normal range of motion.  Edema: None  Mental Status: Normal mood and affect. Normal behavior. Normal judgment and thought content.   Assessment and Plan:  Pregnancy: [id number] at [redacted]w[redacted]d 1. Supervision of high risk pregnancy in third trimester - GBS and genital cultures collected.  2. Diet controlled gestational diabetes mellitus (GDM) in third trimester - Patient encouraged to record her glucose levels. - Weekly antenatal  testing encouraged.  Patient agrees to have NST this Thursday.    3. History of vitamin D deficiency - Patient to continue Vitamin D3.    Preterm labor symptoms and general obstetric precautions including but not limited to vaginal bleeding, contractions, leaking of fluid and fetal movement were reviewed in detail with the patient. Please refer to After Visit Summary for other counseling recommendations.   Return in about 1 week (around 11/14/2019) for HROB , Start NSTs weekly on this thursday.  Future Appointments  Date Time Provider Department Center  11/21/2019 10:45 AM [facility] NURSE [facility]  11/21/2019 11:00 AM [facility] US1 [facility]    Johnny Bridge, MD

## 2019-11-07 NOTE — Addendum Note (Signed)
Addended by: Eldred Manges on: 11/07/2019 04:23 PM   Modules accepted: Orders

## 2019-11-07 NOTE — Progress Notes (Signed)
Pt is here for ROB, [redacted]w[redacted]d. GDM. Follow up US growth scheduled for 11/21/19. Pt reports she has been checking her BG, but has not logged it. She reports fasting levels 72-86 and after meals 95-112.

## 2019-11-08 LAB — CERVICOVAGINAL ANCILLARY ONLY
Chlamydia: NEGATIVE
Comment: NEGATIVE
Comment: NORMAL
Neisseria Gonorrhea: NEGATIVE

## 2019-11-09 ENCOUNTER — Ambulatory Visit (INDEPENDENT_AMBULATORY_CARE_PROVIDER_SITE_OTHER): Payer: Medicaid Other

## 2019-11-09 ENCOUNTER — Other Ambulatory Visit: Payer: Self-pay

## 2019-11-09 ENCOUNTER — Encounter: Payer: Self-pay | Admitting: Obstetrics & Gynecology

## 2019-11-09 VITALS — BP 116/75 | HR 73 | Wt 203.0 lb

## 2019-11-09 DIAGNOSIS — Z3A36 36 weeks gestation of pregnancy: Secondary | ICD-10-CM

## 2019-11-09 DIAGNOSIS — Z3689 Encounter for other specified antenatal screening: Secondary | ICD-10-CM

## 2019-11-09 LAB — STREP GP B NAA: Strep Gp B NAA: NEGATIVE

## 2019-11-09 NOTE — Progress Notes (Addendum)
SUBJECTIVE [redacted]w[redacted]d OB presents for NST. Patient placed on NST machine for 20 minutes.  NST Reactive per AP.  ASSESSMENT Basline: 130's, moderate variability  Decels:  None Reactive    PLAN NST in 1 week.

## 2019-11-14 ENCOUNTER — Other Ambulatory Visit: Payer: Self-pay | Admitting: Medical

## 2019-11-14 ENCOUNTER — Encounter: Payer: Self-pay | Admitting: Obstetrics and Gynecology

## 2019-11-14 ENCOUNTER — Ambulatory Visit (INDEPENDENT_AMBULATORY_CARE_PROVIDER_SITE_OTHER): Payer: Medicaid Other | Admitting: Obstetrics and Gynecology

## 2019-11-14 ENCOUNTER — Other Ambulatory Visit: Payer: Self-pay

## 2019-11-14 VITALS — BP 111/77 | HR 83 | Wt 203.0 lb

## 2019-11-14 DIAGNOSIS — O0993 Supervision of high risk pregnancy, unspecified, third trimester: Secondary | ICD-10-CM

## 2019-11-14 DIAGNOSIS — Z8639 Personal history of other endocrine, nutritional and metabolic disease: Secondary | ICD-10-CM

## 2019-11-14 DIAGNOSIS — Z3A37 37 weeks gestation of pregnancy: Secondary | ICD-10-CM

## 2019-11-14 DIAGNOSIS — O24410 Gestational diabetes mellitus in pregnancy, diet controlled: Secondary | ICD-10-CM

## 2019-11-14 LAB — GLUCOSE, POCT (MANUAL RESULT ENTRY): POC Glucose: 84 mg/dl (ref 70–99)

## 2019-11-14 NOTE — Progress Notes (Signed)
Pt presents for ROB. Pt did not bring CBG reading log today but she promises to bring on Thursday at NST visit.  Fasting CBG this morning - 84

## 2019-11-14 NOTE — Progress Notes (Signed)
Subjective:  Shelby Paul is a 39 y.o. [id number] at [redacted]w[redacted]d being seen today for ongoing prenatal care.  She is currently monitored for the following issues for this high-risk pregnancy and has Supervision of normal intrauterine pregnancy in multigravida in third trimester; Late prenatal care; History of vitamin D deficiency; and Gestational diabetes mellitus (GDM) in third trimester on their problem list.  GDM: Patient taking no medications for diabetes.  Reports no hypoglycemic episodes.  Fasting: 80s  (in office fingerstick 84) 2hr PP: 100 - 112  The patient did not bring her glucose log.  Patient reports backache.  Contractions: Not present. Vag. Bleeding: None.  Movement: Present. Denies leaking of fluid.   The following portions of the patient's history were reviewed and updated as appropriate: allergies, current medications, past family history, past medical history, past social history, past surgical history and problem list. Problem list updated.  Objective:   Vitals:   11/14/19 0842  BP: 111/77  Pulse: 83  Weight: 203 lb (92.1 kg)    Fetal Status: Fetal Heart Rate (bpm): 130 Fundal Height: 38 cm Movement: Present  Presentation: Vertex  General:  Alert, oriented and cooperative. Patient is in no acute distress.  Skin: Skin is warm and dry. No rash noted.   Cardiovascular: Normal heart rate noted  Respiratory: Normal respiratory effort, no problems with respiration noted  Abdomen: Soft, gravid, appropriate for gestational age. Pain/Pressure: Present     Pelvic: Vag. Bleeding: None     Cervical exam deferred        Extremities: Normal range of motion.  Edema: None  Mental Status: Normal mood and affect. Normal behavior. Normal judgment and thought content.   Urinalysis:      Assessment and Plan:  Pregnancy: [id number] at [redacted]w[redacted]d  1. [redacted] weeks gestation of pregnancy - Recognition of labor discussed.  2. Supervision of high risk pregnancy in third trimester   3. Diet  controlled gestational diabetes mellitus (GDM) in third trimester - Patient for repeat growth scan next week. - Patient to continue weekly antenatal testing. - Patient encouraged to bring glucose log to every visit. - Kick counts discussed. - POCT Glucose (CBG)  Term labor symptoms and general obstetric precautions including but not limited to vaginal bleeding, contractions, leaking of fluid and fetal movement were reviewed in detail with the patient. Please refer to After Visit Summary for other counseling recommendations.  Return in about 1 week (around 11/21/2019) for HROB.   Johnny Bridge, MD

## 2019-11-16 ENCOUNTER — Other Ambulatory Visit: Payer: Self-pay

## 2019-11-16 ENCOUNTER — Ambulatory Visit: Payer: Medicaid Other

## 2019-11-16 VITALS — BP 110/68 | HR 73 | Wt 203.0 lb

## 2019-11-16 DIAGNOSIS — O24410 Gestational diabetes mellitus in pregnancy, diet controlled: Secondary | ICD-10-CM

## 2019-11-16 DIAGNOSIS — Z3483 Encounter for supervision of other normal pregnancy, third trimester: Secondary | ICD-10-CM

## 2019-11-16 NOTE — Progress Notes (Signed)
Pt is in the office for NST, reactive

## 2019-11-18 NOTE — Progress Notes (Signed)
Patient was assessed and managed by nursing staff during this encounter. I have reviewed the chart and agree with the documentation and plan. I have also made any necessary editorial changes.  NST reviewed and reactive  Catalina Antigua, MD 11/18/2019 11:55 AM

## 2019-11-20 ENCOUNTER — Other Ambulatory Visit: Payer: Self-pay

## 2019-11-20 ENCOUNTER — Encounter: Payer: Self-pay | Admitting: Obstetrics and Gynecology

## 2019-11-20 ENCOUNTER — Ambulatory Visit (INDEPENDENT_AMBULATORY_CARE_PROVIDER_SITE_OTHER): Payer: Medicaid Other | Admitting: Obstetrics and Gynecology

## 2019-11-20 VITALS — BP 96/63 | HR 80 | Wt 204.0 lb

## 2019-11-20 DIAGNOSIS — O24410 Gestational diabetes mellitus in pregnancy, diet controlled: Secondary | ICD-10-CM

## 2019-11-20 DIAGNOSIS — Z3483 Encounter for supervision of other normal pregnancy, third trimester: Secondary | ICD-10-CM

## 2019-11-20 NOTE — Progress Notes (Signed)
Pt states glucose has been WNL.

## 2019-11-20 NOTE — Progress Notes (Signed)
   PRENATAL VISIT NOTE  Subjective:  Shelby Paul is a 39 y.o. [id number] at [redacted]w[redacted]d being seen today for ongoing prenatal care.  She is currently monitored for the following issues for this high-risk pregnancy and has Supervision of normal intrauterine pregnancy in multigravida in third trimester; Late prenatal care; History of vitamin D deficiency; and Gestational diabetes mellitus (GDM) in third trimester on their problem list.  Patient reports no complaints.  Contractions: Irregular. Vag. Bleeding: None.  Movement: Present. Denies leaking of fluid.   The following portions of the patient's history were reviewed and updated as appropriate: allergies, current medications, past family history, past medical history, past social history, past surgical history and problem list.   Objective:   Vitals:   11/20/19 1307  BP: 96/63  Pulse: 80  Weight: 204 lb (92.5 kg)    Fetal Status: Fetal Heart Rate (bpm): 140 Fundal Height: 38 cm Movement: Present     General:  Alert, oriented and cooperative. Patient is in no acute distress.  Skin: Skin is warm and dry. No rash noted.   Cardiovascular: Normal heart rate noted  Respiratory: Normal respiratory effort, no problems with respiration noted  Abdomen: Soft, gravid, appropriate for gestational age.  Pain/Pressure: Present     Pelvic: Cervical exam deferred        Extremities: Normal range of motion.     Mental Status: Normal mood and affect. Normal behavior. Normal judgment and thought content.   Assessment and Plan:  Pregnancy: [id number] at [redacted]w[redacted]d 1. Supervision of normal intrauterine pregnancy in multigravida in third trimester Patient is doing well without complaints  2. Diet controlled gestational diabetes mellitus (GDM) in third trimester Patient did not bring log and reports fasting of 74 and all pp less than 120 Normal growth ultrasound 9/14 MFM scan scheduled tomorrow Will plan for IOL between 39-40 weeks. Patient desires to start IOL on  a friday  Term labor symptoms and general obstetric precautions including but not limited to vaginal bleeding, contractions, leaking of fluid and fetal movement were reviewed in detail with the patient. Please refer to After Visit Summary for other counseling recommendations.   No follow-ups on file.  Future Appointments  Date Time Provider Department Center  11/21/2019 10:45 AM [facility] NURSE [facility]  11/21/2019 11:00 AM [facility] US1 [facility]  11/27/2019  9:00 AM Khristie Sak, Gigi Gin, MD [facility] None  12/04/2019  9:45 AM Conan Bowens, MD [facility] None    Catalina Antigua, MD

## 2019-11-21 ENCOUNTER — Ambulatory Visit: Payer: Medicaid Other | Attending: Obstetrics and Gynecology

## 2019-11-21 ENCOUNTER — Ambulatory Visit: Payer: Medicaid Other | Admitting: *Deleted

## 2019-11-21 ENCOUNTER — Encounter: Payer: Self-pay | Admitting: *Deleted

## 2019-11-21 DIAGNOSIS — O24410 Gestational diabetes mellitus in pregnancy, diet controlled: Secondary | ICD-10-CM

## 2019-11-21 DIAGNOSIS — Z3A30 30 weeks gestation of pregnancy: Secondary | ICD-10-CM | POA: Diagnosis not present

## 2019-11-21 DIAGNOSIS — O0933 Supervision of pregnancy with insufficient antenatal care, third trimester: Secondary | ICD-10-CM

## 2019-11-21 DIAGNOSIS — O09523 Supervision of elderly multigravida, third trimester: Secondary | ICD-10-CM | POA: Diagnosis not present

## 2019-11-22 ENCOUNTER — Other Ambulatory Visit: Payer: Self-pay | Admitting: Advanced Practice Midwife

## 2019-11-24 ENCOUNTER — Telehealth (HOSPITAL_COMMUNITY): Payer: Self-pay | Admitting: *Deleted

## 2019-11-24 NOTE — Telephone Encounter (Signed)
Preadmission screen  

## 2019-11-27 ENCOUNTER — Telehealth (HOSPITAL_COMMUNITY): Payer: Self-pay | Admitting: *Deleted

## 2019-11-27 ENCOUNTER — Other Ambulatory Visit: Payer: Self-pay

## 2019-11-27 ENCOUNTER — Encounter: Payer: Self-pay | Admitting: Obstetrics and Gynecology

## 2019-11-27 ENCOUNTER — Encounter (HOSPITAL_COMMUNITY): Payer: Self-pay | Admitting: *Deleted

## 2019-11-27 ENCOUNTER — Ambulatory Visit (INDEPENDENT_AMBULATORY_CARE_PROVIDER_SITE_OTHER): Payer: Medicaid Other | Admitting: Obstetrics and Gynecology

## 2019-11-27 VITALS — BP 117/76 | HR 75 | Wt 202.3 lb

## 2019-11-27 DIAGNOSIS — Z3483 Encounter for supervision of other normal pregnancy, third trimester: Secondary | ICD-10-CM

## 2019-11-27 DIAGNOSIS — O24410 Gestational diabetes mellitus in pregnancy, diet controlled: Secondary | ICD-10-CM

## 2019-11-27 NOTE — Progress Notes (Signed)
° °  PRENATAL VISIT NOTE  Subjective:  Shelby Paul is a 39 y.o. [id number] at [redacted]w[redacted]d being seen today for ongoing prenatal care.  She is currently monitored for the following issues for this high-risk pregnancy and has Supervision of normal intrauterine pregnancy in multigravida in third trimester; Late prenatal care; History of vitamin D deficiency; and Gestational diabetes mellitus (GDM) in third trimester on their problem list.  Patient reports no complaints.  Contractions: Irregular.  .  Movement: Present. Denies leaking of fluid.   The following portions of the patient's history were reviewed and updated as appropriate: allergies, current medications, past family history, past medical history, past social history, past surgical history and problem list.   Objective:   Vitals:   11/27/19 0908  BP: 117/76  Pulse: 75  Weight: 202 lb 4.8 oz (91.8 kg)    Fetal Status: Fetal Heart Rate (bpm): 148 Fundal Height: 38 cm Movement: Present     General:  Alert, oriented and cooperative. Patient is in no acute distress.  Skin: Skin is warm and dry. No rash noted.   Cardiovascular: Normal heart rate noted  Respiratory: Normal respiratory effort, no problems with respiration noted  Abdomen: Soft, gravid, appropriate for gestational age.  Pain/Pressure: Present     Pelvic: Cervical exam deferred        Extremities: Normal range of motion.  Edema: None  Mental Status: Normal mood and affect. Normal behavior. Normal judgment and thought content.   Assessment and Plan:  Pregnancy: L3Y1017 at [redacted]w[redacted]d 1. Supervision of normal intrauterine pregnancy in multigravida in third trimester Patient is doing well without complaints   2. Diet controlled gestational diabetes mellitus (GDM) in third trimester Patient did not bring log book and reports fasting 77 and pp no higher than 110 Normal growth on 10/12 Patient scheduled for IOL on 10/22  Term labor symptoms and general obstetric precautions including  but not limited to vaginal bleeding, contractions, leaking of fluid and fetal movement were reviewed in detail with the patient. Please refer to After Visit Summary for other counseling recommendations.   Return in about 6 weeks (around 01/08/2020) for postpartum.  Future Appointments  Date Time Provider Department Center  11/29/2019  9:35 AM [facility]ENING [facility] None  12/01/2019  6:45 AM [facility] SCHED ROOM [facility] None  12/04/2019  9:45 AM Conan Bowens, MD [facility] None    Catalina Antigua, MD

## 2019-11-27 NOTE — Progress Notes (Signed)
Pt reports fetal movement and some contractions. Reports fasting BG 77 this morning.

## 2019-11-27 NOTE — Telephone Encounter (Signed)
Preadmission screen  

## 2019-11-29 ENCOUNTER — Other Ambulatory Visit (HOSPITAL_COMMUNITY)
Admission: RE | Admit: 2019-11-29 | Discharge: 2019-11-29 | Disposition: A | Discharge status: Home / Self Care | Payer: Medicaid Other | Source: Ambulatory Visit | Attending: Family Medicine | Admitting: Family Medicine

## 2019-11-29 DIAGNOSIS — Z01812 Encounter for preprocedural laboratory examination: Secondary | ICD-10-CM | POA: Insufficient documentation

## 2019-11-29 DIAGNOSIS — Z20822 Contact with and (suspected) exposure to covid-19: Secondary | ICD-10-CM | POA: Diagnosis not present

## 2019-11-29 LAB — SARS CORONAVIRUS 2 (TAT 6-24 HRS): SARS Coronavirus 2: NEGATIVE

## 2019-12-01 ENCOUNTER — Inpatient Hospital Stay (HOSPITAL_COMMUNITY): Payer: Medicaid Other

## 2019-12-01 ENCOUNTER — Inpatient Hospital Stay (HOSPITAL_COMMUNITY)
Admission: AD | Admit: 2019-12-01 | Discharge: 2019-12-05 | DRG: 788 | Disposition: A | Discharge status: Home / Self Care | Payer: Medicaid Other | Attending: Obstetrics & Gynecology | Admitting: Obstetrics & Gynecology

## 2019-12-01 ENCOUNTER — Other Ambulatory Visit: Payer: Self-pay

## 2019-12-01 ENCOUNTER — Encounter (HOSPITAL_COMMUNITY): Payer: Self-pay | Admitting: Obstetrics and Gynecology

## 2019-12-01 DIAGNOSIS — Z3490 Encounter for supervision of normal pregnancy, unspecified, unspecified trimester: Secondary | ICD-10-CM | POA: Diagnosis present

## 2019-12-01 DIAGNOSIS — O24420 Gestational diabetes mellitus in childbirth, diet controlled: Principal | ICD-10-CM | POA: Diagnosis present

## 2019-12-01 DIAGNOSIS — O24410 Gestational diabetes mellitus in pregnancy, diet controlled: Principal | ICD-10-CM

## 2019-12-01 DIAGNOSIS — Z3A39 39 weeks gestation of pregnancy: Secondary | ICD-10-CM

## 2019-12-01 DIAGNOSIS — O0930 Supervision of pregnancy with insufficient antenatal care, unspecified trimester: Secondary | ICD-10-CM

## 2019-12-01 DIAGNOSIS — Z419 Encounter for procedure for purposes other than remedying health state, unspecified: Secondary | ICD-10-CM

## 2019-12-01 DIAGNOSIS — O24419 Gestational diabetes mellitus in pregnancy, unspecified control: Secondary | ICD-10-CM | POA: Diagnosis present

## 2019-12-01 LAB — CBC
HCT: 36.3 % (ref 36.0–46.0)
Hemoglobin: 12.1 g/dL (ref 12.0–15.0)
MCH: 29.2 pg (ref 26.0–34.0)
MCHC: 33.3 g/dL (ref 30.0–36.0)
MCV: 87.7 fL (ref 80.0–100.0)
Platelets: 126 10*3/uL — ABNORMAL LOW (ref 150–400)
RBC: 4.14 MIL/uL (ref 3.87–5.11)
RDW: 12.3 % (ref 11.5–15.5)
WBC: 4.9 10*3/uL (ref 4.0–10.5)
nRBC: 0 % (ref 0.0–0.2)

## 2019-12-01 LAB — TYPE AND SCREEN
ABO/RH(D): B POS
Antibody Screen: NEGATIVE

## 2019-12-01 LAB — RPR: RPR Ser Ql: NONREACTIVE

## 2019-12-01 LAB — GLUCOSE, CAPILLARY
Glucose-Capillary: 95 mg/dL (ref 70–99)
Glucose-Capillary: 116 mg/dL — ABNORMAL HIGH (ref 70–99)

## 2019-12-01 MED ORDER — MISOPROSTOL 25 MCG QUARTER TABLET
ORAL_TABLET | ORAL | Status: AC
  Administered 2019-12-01: 25 ug via VAGINAL
  Filled 2019-12-01: qty 1

## 2019-12-01 MED ORDER — OXYCODONE-ACETAMINOPHEN 5-325 MG PO TABS: 2.0000 | ORAL_TABLET | ORAL | Status: DC | PRN

## 2019-12-01 MED ORDER — MISOPROSTOL 50MCG HALF TABLET
50.0000 ug | ORAL_TABLET | ORAL | Status: DC
  Administered 2019-12-01: 50 ug via BUCCAL
  Filled 2019-12-01: qty 1

## 2019-12-01 MED ORDER — OXYTOCIN-SODIUM CHLORIDE 30-0.9 UT/500ML-% IV SOLN: 2.5000 [IU]/h | INTRAVENOUS | Status: DC

## 2019-12-01 MED ORDER — TERBUTALINE SULFATE 1 MG/ML IJ SOLN
0.2500 mg | Freq: Once | INTRAMUSCULAR | Status: DC | PRN
  Filled 2019-12-01: qty 1

## 2019-12-01 MED ORDER — TERBUTALINE SULFATE 1 MG/ML IJ SOLN
0.2500 mg | Freq: Once | INTRAMUSCULAR | Status: AC | PRN
  Administered 2019-12-02: 0.25 mg via SUBCUTANEOUS

## 2019-12-01 MED ORDER — LACTATED RINGERS IV SOLN: 500.0000 mL | INTRAVENOUS | Status: DC | PRN

## 2019-12-01 MED ORDER — LIDOCAINE HCL (PF) 1 % IJ SOLN: 30.0000 mL | INTRAMUSCULAR | Status: DC | PRN

## 2019-12-01 MED ORDER — FENTANYL CITRATE (PF) 100 MCG/2ML IJ SOLN
100.0000 ug | INTRAMUSCULAR | Status: DC | PRN
  Administered 2019-12-01: 100 ug via INTRAVENOUS
  Filled 2019-12-01: qty 2

## 2019-12-01 MED ORDER — ONDANSETRON HCL 4 MG/2ML IJ SOLN: 4.0000 mg | Freq: Four times a day (QID) | INTRAMUSCULAR | Status: DC | PRN

## 2019-12-01 MED ORDER — OXYTOCIN BOLUS FROM INFUSION: 333.0000 mL | Freq: Once | INTRAVENOUS | Status: DC

## 2019-12-01 MED ORDER — OXYCODONE-ACETAMINOPHEN 5-325 MG PO TABS: 1.0000 | ORAL_TABLET | ORAL | Status: DC | PRN

## 2019-12-01 MED ORDER — LACTATED RINGERS IV SOLN
INTRAVENOUS | Status: DC
  Administered 2019-12-02: 125 mL via INTRAVENOUS

## 2019-12-01 MED ORDER — MISOPROSTOL 25 MCG QUARTER TABLET: 25.0000 ug | ORAL_TABLET | ORAL | Status: DC

## 2019-12-01 MED ORDER — OXYTOCIN-SODIUM CHLORIDE 30-0.9 UT/500ML-% IV SOLN
1.0000 m[IU]/min | INTRAVENOUS | Status: DC
  Administered 2019-12-01: 2 m[IU]/min via INTRAVENOUS
  Filled 2019-12-01: qty 500

## 2019-12-01 MED ORDER — ACETAMINOPHEN 325 MG PO TABS: 650.0000 mg | ORAL_TABLET | ORAL | Status: DC | PRN

## 2019-12-01 MED ORDER — SOD CITRATE-CITRIC ACID 500-334 MG/5ML PO SOLN
30.0000 mL | ORAL | Status: DC | PRN
  Filled 2019-12-01: qty 15

## 2019-12-01 NOTE — Progress Notes (Signed)
Labor Progress Note Shelby Paul is a 39 y.o. K9V7473 at [redacted]w[redacted]d presented for IOL for A1GDM.  S: Pt endorsing some discomfort with contractions.  O:  BP 106/75   Pulse 79   Temp 98.2 F (36.8 C) (Oral)   Resp 18   Ht 5\' 4"  (1.626 m)   Wt 93.4 kg   LMP 02/26/2019   BMI 35.36 kg/m  EFM: baseline 135/moderate variability/pos accels/ neg decels Toco: q2-64min  CVE: Dilation: 2 Effacement (%): Thick Cervical Position: Posterior Station: -3 Presentation: Vertex Exam by:: Dr. [email or web address]   A&P: 39 y.o. 20 [redacted]w[redacted]d here for IOL for A1GDM. #Labor: Slight progression of cervical dilation from prior. Pt strongly declines FB s/p counseling. Given shared decision making, pitocin initiated at 2122. Will continue monitor fetal tolerance #Pain: pt plans for epidural #FWB: cat I strip #GBS negative  #A1GDM: BG 95 > 116. Will continue q4hr BG checks given pt recently eating dinner.   2123, MD 10:17 PM

## 2019-12-01 NOTE — Progress Notes (Signed)
Labor Progress Note Shelby Paul is a 39 y.o. [id number] at [redacted]w[redacted]d presented for IOL for A1GDM  S: Feeling some cramping   O:  BP 133/83 (BP Location: Right Arm)   Pulse 70   Temp 98.2 F (36.8 C) (Oral)   Resp 18   Ht 5\' 4"  (1.626 m)   Wt 93.4 kg   LMP 02/26/2019   BMI 35.36 kg/m  EFM: 125/mod variability/pos accels/ neg decels  Toco: q67min   CVE: Dilation: 1 Effacement (%): 40 Station: -3 Presentation: Vertex Exam by:: Arlett Goold md   A&P: 39 y.o. 20 [redacted]w[redacted]d here for IOL for A1GDM  #Labor: Exam unchanged from prior, will proceed w second dose cytotec   #A1GDM - cbg 95 on arrival, continue q4 cbg   #Pain: epidural prn  #FWB: cat I  #GBS negative   [redacted]w[redacted]d, MD 3:26 PM

## 2019-12-01 NOTE — H&P (Signed)
OBSTETRIC ADMISSION HISTORY AND PHYSICAL  Shelby Paul is a 39 y.o. female [phone] with IUP at 72w5dby LMP presenting for IOL for a1GDM. She reports +FMs, No LOF, no VB, no blurry vision, headaches or peripheral edema, and RUQ pain.  She plans on breast feeding. She is undecided on birth control. [facility] --->  Estimated Date of Delivery: 12/03/19  Sono:    @[redacted]w[redacted]d , CWD, normal anatomy, cephalic presentation,  Anterior placenta, 3499g, 69% EFW   Prenatal History/Complications:  AG4WNU- reports well controlled on diet  Late to prenatal care  BMI 32  Past Medical History: Past Medical History:  Diagnosis Date  . Anxiety   . Depression   . Gallstones 06/2011  . Pancreatitis 06/2011    Past Surgical History: Past Surgical History:  Procedure Laterality Date  . NO PAST SURGERIES      Obstetrical History: OB History    Gravida  6   Para  4   Term  4   Preterm  0   AB  1   Living  4     SAB  1   TAB  0   Ectopic  0   Multiple      Live Births  4           Social History Social History   Socioeconomic History  . Marital status: Unknown    Spouse name: Not on file  . Number of children: Not on file  . Years of education: Not on file  . Highest education level: Not on file  Occupational History  . Not on file  Tobacco Use  . Smoking status: Never Smoker  . Smokeless tobacco: Never Used  Vaping Use  . Vaping Use: Never used  Substance and Sexual Activity  . Alcohol use: No  . Drug use: No  . Sexual activity: Yes    Partners: Male  Other Topics Concern  . Not on file  Social History Narrative  . Not on file   Social Determinants of Health   Financial Resource Strain:   . Difficulty of Paying Living Expenses: Not on file  Food Insecurity: No Food Insecurity  . Worried About RCharity fundraiserin the Last Year: Never true  . Ran Out of Food in the Last Year: Never true  Transportation  Needs:   . Lack of Transportation (Medical): Not on file  . Lack of Transportation (Non-Medical): Not on file  Physical Activity:   . Days of Exercise per Week: Not on file  . Minutes of Exercise per Session: Not on file  Stress:   . Feeling of Stress : Not on file  Social Connections:   . Frequency of Communication with Friends and Family: Not on file  . Frequency of Social Gatherings with Friends and Family: Not on file  . Attends Religious Services: Not on file  . Active Member of Clubs or Organizations: Not on file  . Attends CArchivistMeetings: Not on file  . Marital Status: Not on file    Family History: Family History  Problem Relation Age of Onset  . Diabetes Mother   . Hypertension Mother   . Diabetes Father     Allergies: No Known Allergies  Medications Prior to Admission  Medication Sig Dispense Refill Last Dose  . Accu-Chek Softclix Lancets lancets 1 each by Other route 4 (four) times daily. 100 each 12   .  acetaminophen (TYLENOL 8 HOUR) 650 MG CR tablet Take 1 tablet (650 mg total) by mouth every 8 (eight) hours as needed. (Patient not taking: Reported on 09/13/2019) 30 tablet 0   . Blood Glucose Monitoring Suppl (ACCU-CHEK GUIDE) w/Device KIT 1 Device by Does not apply route 4 (four) times daily. 1 kit 0   . Blood Pressure Monitor KIT 1 Device by Does not apply route once a week. To be monitored Regularly at home. 1 kit 0   . cholecalciferol (VITAMIN D3) 25 MCG (1000 UNIT) tablet Take 1 tablet (1,000 Units total) by mouth daily. (Patient not taking: Reported on 11/27/2019) 30 tablet 1   . Elastic Bandages & Supports (COMFORT FIT MATERNITY SUPP LG) MISC 1 Units by Does not apply route daily as needed. (Patient not taking: Reported on 11/14/2019) 1 each 0   . glucose blood (ACCU-CHEK GUIDE) test strip Use to check blood sugars four times a day was instructed 50 each 12   . Prenatal Vit-Fe Fumarate-FA (PREPLUS) 27-1 MG TABS Take 1 tablet by mouth daily. 30  tablet 13      Review of Systems   All systems reviewed and negative except as stated in HPI  Blood pressure 114/72, pulse 77, temperature 98.7 F (37.1 C), temperature source Oral, resp. rate 20, height 5' 4"  (1.626 m), weight 93.4 kg, last menstrual period 02/26/2019, unknown if currently breastfeeding. General appearance: alert, cooperative and appears stated age Lungs: clear to auscultation bilaterally Heart: regular rate and rhythm Abdomen: soft, non-tender; bowel sounds normal Extremities: Homans sign is negative, no sign of DVT Presentation: cephalic Fetal monitoring: baseline 130 mod variability pos accels neg decels,  Uterine activityNone Dilation: 1 Effacement (%): 40 Station: -3 Exam by:: dr. Berniece Andreas Cephalic by [place]   Prenatal labs: ABO, Rh: --/--/PENDING (10/22 2229) Antibody: PENDING (10/22 0933) Rubella: 19.40 (07/07 1414) RPR: Non Reactive (08/04 0911)  HBsAg: Negative (07/07 1414)  HIV: Non Reactive (08/04 0911)  GBS: Negative/-- (09/28 0425)  2 hr Glucola abnormal Genetic screening  Low risk Anatomy US incomplete but normal, follow ups were normal  Prenatal Transfer Tool  Maternal Diabetes: Yes:  Diabetes Type:  Diet controlled Genetic Screening: Normal Maternal Ultrasounds/Referrals: Normal Fetal Ultrasounds or other Referrals:  None Maternal Substance Abuse:  No Significant Maternal Medications:  None Significant Maternal Lab Results: gbs negative  Results for orders placed or performed during the hospital encounter of 12/01/19 (from the past 24 hour(s))  CBC   Collection Time: 12/01/19  9:33 AM  Result Value Ref Range   WBC 4.9 4.0 - 10.5 K/uL   RBC 4.14 3.87 - 5.11 MIL/uL   Hemoglobin 12.1 12.0 - 15.0 g/dL   HCT 36.3 36 - 46 %   MCV 87.7 80.0 - 100.0 fL   MCH 29.2 26.0 - 34.0 pg   MCHC 33.3 30.0 - 36.0 g/dL   RDW 12.3 11.5 - 15.5 %   Platelets 126 (L) 150 - 400 K/uL   nRBC 0.0 0.0 - 0.2 %  Type and screen   Collection Time: 12/01/19   9:33 AM  Result Value Ref Range   ABO/RH(D) PENDING    Antibody Screen PENDING    Sample Expiration      12/04/2019,2359 Performed at [facility], [address] [place], [place] [address]     Patient Active Problem List   Diagnosis Date Noted  . Encounter for induction of labor 12/01/2019  . Gestational diabetes mellitus (GDM) in third trimester 09/14/2019  .  History of vitamin D deficiency 09/13/2019  . Late prenatal care 08/16/2019  . Supervision of normal intrauterine pregnancy in multigravida in third trimester 07/17/2019    Assessment/Plan:  Shelby Paul is a 39 y.o. [id number] at 37w5dhere for IOL for a1gdm   #Induction of Labor: initial exam 1/40/-3, confirmed cephalic by [place] Proceed w cytotec and reassess in four hours.    #A1GDM: well controlled per patient report   #Pain: Pain meds, epidural prn  #FWB: Cat I  #ID:  gbs neg #MOF: breast #MOC:undecided  #Circ:  Yes   JJanet Berlin MD  12/01/2019, 10:33 AM

## 2019-12-02 ENCOUNTER — Inpatient Hospital Stay (HOSPITAL_COMMUNITY): Payer: Medicaid Other | Admitting: Anesthesiology

## 2019-12-02 ENCOUNTER — Inpatient Hospital Stay (HOSPITAL_COMMUNITY): Payer: Medicaid Other

## 2019-12-02 ENCOUNTER — Encounter (HOSPITAL_COMMUNITY): Payer: Self-pay | Admitting: Family Medicine

## 2019-12-02 ENCOUNTER — Encounter (HOSPITAL_COMMUNITY)
Admission: AD | Disposition: A | Discharge status: Home / Self Care | Payer: Self-pay | Source: Home / Self Care | Attending: Obstetrics & Gynecology

## 2019-12-02 DIAGNOSIS — Z3A39 39 weeks gestation of pregnancy: Secondary | ICD-10-CM

## 2019-12-02 LAB — GLUCOSE, CAPILLARY: Glucose-Capillary: 83 mg/dL (ref 70–99)

## 2019-12-02 LAB — CBC
HCT: 36.9 % (ref 36.0–46.0)
Hemoglobin: 12.3 g/dL (ref 12.0–15.0)
MCH: 29.6 pg (ref 26.0–34.0)
MCHC: 33.3 g/dL (ref 30.0–36.0)
MCV: 88.9 fL (ref 80.0–100.0)
Platelets: 106 10*3/uL — ABNORMAL LOW (ref 150–400)
RBC: 4.15 MIL/uL (ref 3.87–5.11)
RDW: 12.4 % (ref 11.5–15.5)
WBC: 14.1 10*3/uL — ABNORMAL HIGH (ref 4.0–10.5)
nRBC: 0 % (ref 0.0–0.2)

## 2019-12-02 SURGERY — Surgical Case
Anesthesia: Epidural

## 2019-12-02 MED ORDER — SIMETHICONE 80 MG PO CHEW: 80.0000 mg | CHEWABLE_TABLET | ORAL | Status: DC | PRN

## 2019-12-02 MED ORDER — NALBUPHINE HCL 10 MG/ML IJ SOLN: 5.0000 mg | Freq: Once | INTRAMUSCULAR | Status: DC | PRN

## 2019-12-02 MED ORDER — DIPHENHYDRAMINE HCL 50 MG/ML IJ SOLN: 12.5000 mg | INTRAMUSCULAR | Status: DC | PRN

## 2019-12-02 MED ORDER — EPHEDRINE 5 MG/ML INJ: 10.0000 mg | INTRAVENOUS | Status: DC | PRN

## 2019-12-02 MED ORDER — DIPHENHYDRAMINE HCL 25 MG PO CAPS: 25.0000 mg | ORAL_CAPSULE | ORAL | Status: DC | PRN

## 2019-12-02 MED ORDER — NALBUPHINE HCL 10 MG/ML IJ SOLN: 5.0000 mg | INTRAMUSCULAR | Status: DC | PRN

## 2019-12-02 MED ORDER — SODIUM CHLORIDE 0.9 % IV SOLN: INTRAVENOUS | Status: DC | PRN

## 2019-12-02 MED ORDER — TRANEXAMIC ACID 1000 MG/10ML IV SOLN
INTRAVENOUS | Status: DC | PRN
  Administered 2019-12-02: 1000 mg via INTRAVENOUS

## 2019-12-02 MED ORDER — DIPHENHYDRAMINE HCL 25 MG PO CAPS: 25.0000 mg | ORAL_CAPSULE | Freq: Four times a day (QID) | ORAL | Status: DC | PRN

## 2019-12-02 MED ORDER — DEXAMETHASONE SODIUM PHOSPHATE 4 MG/ML IJ SOLN
INTRAMUSCULAR | Status: AC
  Filled 2019-12-02: qty 1

## 2019-12-02 MED ORDER — MORPHINE SULFATE (PF) 0.5 MG/ML IJ SOLN
INTRAMUSCULAR | Status: AC
  Filled 2019-12-02: qty 10

## 2019-12-02 MED ORDER — ONDANSETRON HCL 4 MG/2ML IJ SOLN: 4.0000 mg | Freq: Three times a day (TID) | INTRAMUSCULAR | Status: DC | PRN

## 2019-12-02 MED ORDER — OXYCODONE HCL 5 MG PO TABS: 5.0000 mg | ORAL_TABLET | Freq: Once | ORAL | Status: DC | PRN

## 2019-12-02 MED ORDER — NALOXONE HCL 4 MG/10ML IJ SOLN
1.0000 ug/kg/h | INTRAVENOUS | Status: DC | PRN
  Filled 2019-12-02: qty 5

## 2019-12-02 MED ORDER — IBUPROFEN 800 MG PO TABS
800.0000 mg | ORAL_TABLET | Freq: Three times a day (TID) | ORAL | Status: AC
  Administered 2019-12-02 – 2019-12-05 (×9): 800 mg via ORAL
  Filled 2019-12-02 (×9): qty 1

## 2019-12-02 MED ORDER — PRENATAL MULTIVITAMIN CH
1.0000 | ORAL_TABLET | Freq: Every day | ORAL | Status: DC
  Administered 2019-12-02 – 2019-12-05 (×4): 1 via ORAL
  Filled 2019-12-02 (×4): qty 1

## 2019-12-02 MED ORDER — MEPERIDINE HCL 25 MG/ML IJ SOLN: 6.2500 mg | INTRAMUSCULAR | Status: DC | PRN

## 2019-12-02 MED ORDER — KETOROLAC TROMETHAMINE 30 MG/ML IJ SOLN: 30.0000 mg | Freq: Four times a day (QID) | INTRAMUSCULAR | Status: AC | PRN

## 2019-12-02 MED ORDER — LIDOCAINE-EPINEPHRINE (PF) 2 %-1:200000 IJ SOLN
INTRAMUSCULAR | Status: DC | PRN
  Administered 2019-12-02: 10 mL via EPIDURAL
  Administered 2019-12-02: 5 mL via EPIDURAL

## 2019-12-02 MED ORDER — CEFAZOLIN SODIUM-DEXTROSE 2-3 GM-%(50ML) IV SOLR
INTRAVENOUS | Status: DC | PRN
  Administered 2019-12-02: 2 g via INTRAVENOUS

## 2019-12-02 MED ORDER — OXYTOCIN-SODIUM CHLORIDE 30-0.9 UT/500ML-% IV SOLN
INTRAVENOUS | Status: DC | PRN
  Administered 2019-12-02: 30 [IU] via INTRAVENOUS

## 2019-12-02 MED ORDER — OXYCODONE HCL 5 MG PO TABS
5.0000 mg | ORAL_TABLET | ORAL | Status: DC | PRN
  Administered 2019-12-02 – 2019-12-05 (×10): 5 mg via ORAL
  Filled 2019-12-02 (×10): qty 1

## 2019-12-02 MED ORDER — CEFAZOLIN SODIUM-DEXTROSE 2-4 GM/100ML-% IV SOLN
INTRAVENOUS | Status: AC
  Filled 2019-12-02: qty 100

## 2019-12-02 MED ORDER — ONDANSETRON HCL 4 MG/2ML IJ SOLN
INTRAMUSCULAR | Status: AC
  Filled 2019-12-02: qty 2

## 2019-12-02 MED ORDER — LACTATED RINGERS IV SOLN: INTRAVENOUS | Status: DC

## 2019-12-02 MED ORDER — ENOXAPARIN SODIUM 40 MG/0.4ML ~~LOC~~ SOLN
40.0000 mg | SUBCUTANEOUS | Status: DC
  Administered 2019-12-03: 40 mg via SUBCUTANEOUS
  Filled 2019-12-02: qty 0.4

## 2019-12-02 MED ORDER — SCOPOLAMINE 1 MG/3DAYS TD PT72: 1.0000 | MEDICATED_PATCH | Freq: Once | TRANSDERMAL | Status: DC

## 2019-12-02 MED ORDER — ONDANSETRON HCL 4 MG/2ML IJ SOLN
INTRAMUSCULAR | Status: DC | PRN
  Administered 2019-12-02: 4 mg via INTRAVENOUS

## 2019-12-02 MED ORDER — OXYTOCIN-SODIUM CHLORIDE 30-0.9 UT/500ML-% IV SOLN: 2.5000 [IU]/h | INTRAVENOUS | Status: AC

## 2019-12-02 MED ORDER — SODIUM CHLORIDE 0.9 % IR SOLN
Status: DC | PRN
  Administered 2019-12-02: 1

## 2019-12-02 MED ORDER — ONDANSETRON HCL 4 MG/2ML IJ SOLN: 4.0000 mg | Freq: Once | INTRAMUSCULAR | Status: DC | PRN

## 2019-12-02 MED ORDER — BUPIVACAINE HCL 0.25 % IJ SOLN
INTRAMUSCULAR | Status: DC | PRN
  Administered 2019-12-02: 30 mL

## 2019-12-02 MED ORDER — HYDROMORPHONE HCL 1 MG/ML IJ SOLN
INTRAMUSCULAR | Status: AC
  Filled 2019-12-02: qty 0.5

## 2019-12-02 MED ORDER — SODIUM CHLORIDE 0.9% FLUSH: 3.0000 mL | INTRAVENOUS | Status: DC | PRN

## 2019-12-02 MED ORDER — EPHEDRINE 5 MG/ML INJ
INTRAVENOUS | Status: AC
  Filled 2019-12-02: qty 10

## 2019-12-02 MED ORDER — PHENYLEPHRINE 40 MCG/ML (10ML) SYRINGE FOR IV PUSH (FOR BLOOD PRESSURE SUPPORT)
PREFILLED_SYRINGE | INTRAVENOUS | Status: AC
  Filled 2019-12-02: qty 10

## 2019-12-02 MED ORDER — SIMETHICONE 80 MG PO CHEW
80.0000 mg | CHEWABLE_TABLET | ORAL | Status: DC
  Administered 2019-12-02 – 2019-12-04 (×3): 80 mg via ORAL
  Filled 2019-12-02 (×3): qty 1

## 2019-12-02 MED ORDER — MORPHINE SULFATE (PF) 0.5 MG/ML IJ SOLN
INTRAMUSCULAR | Status: DC | PRN
  Administered 2019-12-02: 3 mg via EPIDURAL

## 2019-12-02 MED ORDER — PHENYLEPHRINE 40 MCG/ML (10ML) SYRINGE FOR IV PUSH (FOR BLOOD PRESSURE SUPPORT): 80.0000 ug | PREFILLED_SYRINGE | INTRAVENOUS | Status: DC | PRN

## 2019-12-02 MED ORDER — SIMETHICONE 80 MG PO CHEW
80.0000 mg | CHEWABLE_TABLET | Freq: Three times a day (TID) | ORAL | Status: DC
  Administered 2019-12-02 – 2019-12-05 (×9): 80 mg via ORAL
  Filled 2019-12-02 (×9): qty 1

## 2019-12-02 MED ORDER — HYDROMORPHONE HCL 1 MG/ML IJ SOLN
0.2500 mg | INTRAMUSCULAR | Status: DC | PRN
  Administered 2019-12-02: 0.25 mg via INTRAVENOUS

## 2019-12-02 MED ORDER — BUPIVACAINE HCL (PF) 0.75 % IJ SOLN
INTRAMUSCULAR | Status: DC | PRN
Start: 2019-12-02 — End: 2019-12-02
  Administered 2019-12-02: 12 mL/h via EPIDURAL

## 2019-12-02 MED ORDER — DEXAMETHASONE SODIUM PHOSPHATE 4 MG/ML IJ SOLN
INTRAMUSCULAR | Status: DC | PRN
  Administered 2019-12-02: 4 mg via INTRAVENOUS

## 2019-12-02 MED ORDER — MENTHOL 3 MG MT LOZG: 1.0000 | LOZENGE | OROMUCOSAL | Status: DC | PRN

## 2019-12-02 MED ORDER — NALOXONE HCL 0.4 MG/ML IJ SOLN: 0.4000 mg | INTRAMUSCULAR | Status: DC | PRN

## 2019-12-02 MED ORDER — LACTATED RINGERS IV SOLN: 500.0000 mL | Freq: Once | INTRAVENOUS | Status: DC

## 2019-12-02 MED ORDER — COCONUT OIL OIL: 1.0000 "application " | TOPICAL_OIL | Status: DC | PRN

## 2019-12-02 MED ORDER — FENTANYL-BUPIVACAINE-NACL 0.5-0.125-0.9 MG/250ML-% EP SOLN
12.0000 mL/h | EPIDURAL | Status: DC | PRN
  Filled 2019-12-02: qty 250

## 2019-12-02 MED ORDER — LACTATED RINGERS IV SOLN: INTRAVENOUS | Status: DC | PRN

## 2019-12-02 MED ORDER — SENNOSIDES-DOCUSATE SODIUM 8.6-50 MG PO TABS
2.0000 | ORAL_TABLET | ORAL | Status: DC
  Administered 2019-12-02 – 2019-12-04 (×3): 2 via ORAL
  Filled 2019-12-02 (×3): qty 2

## 2019-12-02 MED ORDER — OXYCODONE HCL 5 MG/5ML PO SOLN: 5.0000 mg | Freq: Once | ORAL | Status: DC | PRN

## 2019-12-02 MED ORDER — WITCH HAZEL-GLYCERIN EX PADS: 1.0000 "application " | MEDICATED_PAD | CUTANEOUS | Status: DC | PRN

## 2019-12-02 MED ORDER — BUPIVACAINE HCL (PF) 0.25 % IJ SOLN
INTRAMUSCULAR | Status: AC
  Filled 2019-12-02: qty 30

## 2019-12-02 MED ORDER — PHENYLEPHRINE HCL (PRESSORS) 10 MG/ML IV SOLN
INTRAVENOUS | Status: DC | PRN
  Administered 2019-12-02: 80 ug via INTRAVENOUS
  Administered 2019-12-02: 120 ug via INTRAVENOUS
  Administered 2019-12-02 (×3): 80 ug via INTRAVENOUS

## 2019-12-02 MED ORDER — EPHEDRINE SULFATE 50 MG/ML IJ SOLN
INTRAMUSCULAR | Status: DC | PRN
  Administered 2019-12-02: 10 mg via INTRAVENOUS

## 2019-12-02 MED ORDER — TRANEXAMIC ACID-NACL 1000-0.7 MG/100ML-% IV SOLN
INTRAVENOUS | Status: AC
  Filled 2019-12-02: qty 100

## 2019-12-02 MED ORDER — TETANUS-DIPHTH-ACELL PERTUSSIS 5-2.5-18.5 LF-MCG/0.5 IM SUSP: 0.5000 mL | Freq: Once | INTRAMUSCULAR | Status: DC

## 2019-12-02 MED ORDER — DIBUCAINE (PERIANAL) 1 % EX OINT: 1.0000 "application " | TOPICAL_OINTMENT | CUTANEOUS | Status: DC | PRN

## 2019-12-02 SURGICAL SUPPLY — 33 items
BENZOIN TINCTURE PRP APPL 2/3 (GAUZE/BANDAGES/DRESSINGS) ×3 IMPLANT
CLAMP CORD UMBIL (MISCELLANEOUS) ×3 IMPLANT
CLOSURE STERI STRIP 1/2 X4 (GAUZE/BANDAGES/DRESSINGS) ×3 IMPLANT
CLOTH BEACON ORANGE TIMEOUT ST (SAFETY) ×3 IMPLANT
DRSG OPSITE POSTOP 4X10 (GAUZE/BANDAGES/DRESSINGS) ×3 IMPLANT
ELECT REM PT RETURN 9FT ADLT (ELECTROSURGICAL) ×3
ELECTRODE REM PT RTRN 9FT ADLT (ELECTROSURGICAL) ×1 IMPLANT
EXTRACTOR VACUUM M CUP 4 TUBE (SUCTIONS) IMPLANT
EXTRACTOR VACUUM M CUP 4' TUBE (SUCTIONS)
GAUZE SPONGE 4X4 12PLY STRL LF (GAUZE/BANDAGES/DRESSINGS) ×6 IMPLANT
GLOVE BIOGEL PI IND STRL 7.0 (GLOVE) ×3 IMPLANT
GLOVE BIOGEL PI INDICATOR 7.0 (GLOVE) ×6
GLOVE ECLIPSE 7.0 STRL STRAW (GLOVE) ×3 IMPLANT
GOWN STRL REUS W/TWL LRG LVL3 (GOWN DISPOSABLE) ×6 IMPLANT
KIT ABG SYR 3ML LUER SLIP (SYRINGE) ×3 IMPLANT
NEEDLE HYPO 22GX1.5 SAFETY (NEEDLE) ×3 IMPLANT
NEEDLE HYPO 25X5/8 SAFETYGLIDE (NEEDLE) ×3 IMPLANT
NS IRRIG 1000ML POUR BTL (IV SOLUTION) ×3 IMPLANT
PACK C SECTION WH (CUSTOM PROCEDURE TRAY) ×3 IMPLANT
PAD ABD 7.5X8 STRL (GAUZE/BANDAGES/DRESSINGS) ×3 IMPLANT
PAD OB MATERNITY 4.3X12.25 (PERSONAL CARE ITEMS) ×3 IMPLANT
PENCIL SMOKE EVAC W/HOLSTER (ELECTROSURGICAL) ×3 IMPLANT
RTRCTR C-SECT PINK 25CM LRG (MISCELLANEOUS) ×3 IMPLANT
STRIP CLOSURE SKIN 1/2X4 (GAUZE/BANDAGES/DRESSINGS) ×2 IMPLANT
SUT MNCRL 0 VIOLET CTX 36 (SUTURE) ×2 IMPLANT
SUT MONOCRYL 0 CTX 36 (SUTURE) ×4
SUT VIC AB 0 CTX 36 (SUTURE) ×2
SUT VIC AB 0 CTX36XBRD ANBCTRL (SUTURE) ×1 IMPLANT
SUT VIC AB 4-0 KS 27 (SUTURE) ×3 IMPLANT
SYR 30ML LL (SYRINGE) ×3 IMPLANT
TOWEL OR 17X24 6PK STRL BLUE (TOWEL DISPOSABLE) ×3 IMPLANT
TRAY FOLEY W/BAG SLVR 14FR LF (SET/KITS/TRAYS/PACK) ×3 IMPLANT
WATER STERILE IRR 1000ML POUR (IV SOLUTION) ×3 IMPLANT

## 2019-12-02 NOTE — Progress Notes (Signed)
Labor Progress Note Shelby Paul is a 39 y.o. C8Y2233 at [redacted]w[redacted]d presented for IOL for A1GDM.  S: Pt resting comfortably. Moderate discomfort reported with contractions.  O:  BP 115/74   Pulse 68   Temp 98.2 F (36.8 C) (Oral)   Resp 18   Ht 5\' 4"  (1.626 m)   Wt 93.4 kg   LMP 02/26/2019   BMI 35.36 kg/m  EFM: baseline 125/moderate variability/pos accels/ neg decels Toco: q2-38min  CVE: Dilation: 4 Effacement (%): 60 Cervical Position: Posterior Station: 0 Presentation: Vertex Exam by:: Dr. [email or web address]   A&P: 39 y.o. 20 [redacted]w[redacted]d here for IOL for A1GDM. #Labor: Pt s/p cytotec x2. Given pt declined FB, was transitioned to pitocin but ultimately discontinued at 2315 given persistent tachysystole. Now s/p AROM for clear fluid. Will plan to restart pitocin if contractions space out. #Pain: pt plans for epidural #FWB: cat I strip #GBS negative  #A1GDM: BG 95 > 116 >83. Plan for FBG check on PPD#1.   2316, MD 2:57 AM

## 2019-12-02 NOTE — Discharge Summary (Signed)
Postpartum Discharge Summary  Date of Service updated 12/05/2019     Patient Name: Shelby Paul DOB: 10-18-1980 MRN: [id number]  Date of admission: 12/01/2019 Delivery date:12/02/2019  Delivering provider: Donnamae Jude  Date of discharge:  12/05/2019 Admitting diagnosis: Encounter for induction of labor [Z34.90] Intrauterine pregnancy: [redacted]w[redacted]d    Secondary diagnosis:  Principal Problem:   Cesarean delivery delivered Active Problems:   Late prenatal care   Gestational diabetes mellitus (GDM) in third trimester   Encounter for induction of labor  Additional problems: as noted above   Discharge diagnosis: Cesarean delivery delivered                                            Post partum procedures:none Augmentation: AROM, Pitocin and Cytotec (pt refused foley bulb) Complications: STAT [facility]: Induction of Labor With Cesarean Section   39y.o. yo [id number] 372w6das admitted to the hospital 12/01/2019 for induction of labor. Patient had a labor course significant for prolonged decel to 30s in FHT at 9cm; unable to reduce cervix with pushing. The patient went for cesarean section due to Non-Reassuring FHR. Delivery details are as follows: Membrane Rupture Time/Date: 2:30 AM ,12/02/2019   Delivery Method: cesarean section low transverse Details of operation can be found in separate operative Note.  Patient had an uncomplicated postpartum course. She is ambulating, tolerating a regular diet, passing flatus, and urinating well. Baby initially was in NICU after low apgar scores. Patient is discharged home in stable condition on 12/05/19.      Newborn Data: Birth date:12/02/2019  Birth time:8:07 AM  Gender:Female  Living status:Living  Apgars:2 ,6  Weight:3320 g                                 Magnesium Sulfate received: No BMZ received: No Rhophylac:N/A MMR:N/A T-DaP:Given prenatally Flu: Yes  Transfusion:No  Physical exam   Vitals:   12/04/19 1943 12/04/19 2333 12/05/19 0606 12/05/19 0806  BP: 110/71 (!) 103/58 123/88 130/86  Pulse: 82 82 79 76  Resp: 13 17 16 16   Temp: 98 F (36.7 C) 98 F (36.7 C) 98.4 F (36.9 C) 98.4 F (36.9 C)  TempSrc: Oral Oral Oral Oral  SpO2: 100% 100% 100% 100%  Weight:      Height:       General: alert, cooperative and no distress Lochia: appropriate Uterine Fundus: firm Incision: Healing well with no significant drainage, No significant erythema DVT Evaluation: No evidence of DVT seen on physical exam. Labs: Lab Results  Component Value Date   WBC 11.6 (H) 12/03/2019   HGB 8.7 (L) 12/03/2019   HCT 25.7 (L) 12/03/2019   MCV 88.3 12/03/2019   PLT 99 (L) 12/03/2019   CMP Latest Ref Rng & Units 05/29/2019  Glucose 70 - 99 mg/dL 121(H)  BUN 6 - 20 mg/dL 6  Creatinine 0.44 - 1.00 mg/dL 0.43(L)  Sodium 135 - 145 mmol/L 133(L)  Potassium 3.5 - 5.1 mmol/L 3.8  Chloride 98 - 111 mmol/L 104  CO2 22 - 32 mmol/L 22  Calcium 8.9 - 10.3 mg/dL 8.6(L)  Total Protein 6.5 - 8.1 g/dL 6.2(L)  Total Bilirubin 0.3 - 1.2 mg/dL 0.4  Alkaline Phos 38 - 126 U/L 34(L)  AST 15 -  41 U/L 17  ALT 0 - 44 U/L 20   Edinburgh Score: Edinburgh Postnatal Depression Scale Screening Tool 12/03/2019  I have been able to laugh and see the funny side of things. (No Data)     After visit meds:  Allergies as of 12/05/2019   No Known Allergies     Medication List    STOP taking these medications   Accu-Chek Guide test strip Generic drug: glucose blood   Accu-Chek Guide w/Device Kit   Accu-Chek Softclix Lancets lancets   acetaminophen 650 MG CR tablet Commonly known as: Tylenol 8 Hour   Blood Pressure Monitor Kit   Comfort Fit Maternity Supp Lg Misc     TAKE these medications   cholecalciferol 25 MCG (1000 UNIT) tablet Commonly known as: VITAMIN D3 Take 1 tablet (1,000 Units total) by mouth daily.   ibuprofen 600 MG tablet Commonly known as: ADVIL Take 1 tablet (600 mg  total) by mouth every 6 (six) hours as needed.   oxyCODONE 5 MG immediate release tablet Commonly known as: Oxy IR/ROXICODONE Take 1-2 tablets (5-10 mg total) by mouth every 4 (four) hours as needed for moderate pain.   PrePLUS 27-1 MG Tabs Take 1 tablet by mouth daily.        Discharge home in stable condition Infant Feeding: No evidence of DVT seen on physical exam. Infant Disposition:home with mother Discharge instruction: per After Visit Summary and Postpartum booklet. Activity: Advance as tolerated. Pelvic rest for 6 weeks.  Diet: routine diet Future Appointments: Future Appointments  Date Time Provider [place]  12/11/2019  1:20 PM [place] None  01/15/2020  8:15 AM [facility] None  01/15/2020  8:30 AM Anyanwu, Sallyanne Havers, MD [facility] None   Follow up Visit:  [place] Follow up in 1 week(s).   Specialty: Obstetrics and Gynecology Why: incision check Contact information: [address] [place] [phone]             Message sent to [facility] clinic on 12/02/19 to schedule PP appt and 1 week incision check  Please schedule this patient for a In person postpartum visit in 4 weeks with the following provider: Any provider. Additional Postpartum F/U:2 hour GTT in 6-8 weeks and Incision check 1 week High risk pregnancy complicated by: [id number], Stat Cesarean with infant in NICU Delivery mode:  cesarean section low transverse Anticipated Birth Control:  Unsure   12/05/2019 Emeterio Reeve, MD

## 2019-12-02 NOTE — Anesthesia Postprocedure Evaluation (Signed)
Anesthesia Post Note  Patient: Shelby Paul  Procedure(s) Performed: CESAREAN SECTION     Patient location during evaluation: PACU Anesthesia Type: Epidural Level of consciousness: oriented and awake and alert Pain management: pain level controlled Vital Signs Assessment: post-procedure vital signs reviewed and stable Respiratory status: spontaneous breathing, respiratory function stable and nonlabored ventilation Cardiovascular status: blood pressure returned to baseline and stable Postop Assessment: no headache, no backache, no apparent nausea or vomiting and epidural receding Anesthetic complications: no   No complications documented.  Last Vitals:  Vitals:   12/02/19 1015 12/02/19 1030  BP: 107/65 108/70  Pulse: 74 74  Resp: 18 16  Temp:    SpO2: 100% 100%    Last Pain:  Vitals:   12/02/19 1100  TempSrc:   PainSc: 6    Pain Goal: Patients Stated Pain Goal: 6 (12/02/19 1100)              Epidural/Spinal Function Cutaneous sensation: Normal sensation (12/02/19 1100), Patient able to flex knees: Yes (12/02/19 1100), Patient able to lift hips off bed: Yes (12/02/19 1100), Back pain beyond tenderness at insertion site: No (12/02/19 1100), Progressively worsening motor and/or sensory loss: No (12/02/19 1100), Bowel and/or bladder incontinence post epidural: No (12/02/19 1100)  Lucretia Kern

## 2019-12-02 NOTE — Transfer of Care (Signed)
Immediate Anesthesia Transfer of Care Note  Patient: Voncille Lo  Procedure(s) Performed: CESAREAN SECTION  Patient Location: PACU  Anesthesia Type:Epidural  Level of Consciousness: awake, alert  and oriented  Airway & Oxygen Therapy: Patient Spontanous Breathing  Post-op Assessment: Report given to RN and Post -op Vital signs reviewed and stable  Post vital signs: Reviewed and stable  Last Vitals:  Vitals Value Taken Time  BP 90/57 12/02/19 0900  Temp    Pulse    Resp 16 12/02/19 0901  SpO2    Vitals shown include unvalidated device data.  Last Pain:  Vitals:   12/02/19 0724  TempSrc: Oral  PainSc: 0-No pain         Complications: No complications documented.

## 2019-12-02 NOTE — Anesthesia Preprocedure Evaluation (Signed)
Anesthesia Evaluation  Patient identified by MRN, date of birth, ID band Patient awake    Reviewed: Allergy & Precautions, NPO status , Patient's Chart, lab work & pertinent test results  Airway Mallampati: II  TM Distance: >3 FB Neck ROM: Full    Dental no notable dental hx.    Pulmonary neg pulmonary ROS,    Pulmonary exam normal breath sounds clear to auscultation       Cardiovascular negative cardio ROS Normal cardiovascular exam Rhythm:Regular Rate:Normal     Neuro/Psych negative neurological ROS  negative psych ROS   GI/Hepatic negative GI ROS, Neg liver ROS,   Endo/Other  diabetes, Well Controlled, Gestational  Renal/GU negative Renal ROS  negative genitourinary   Musculoskeletal negative musculoskeletal ROS (+)   Abdominal   Peds  Hematology negative hematology ROS (+)   Anesthesia Other Findings IOL for gDM  Reproductive/Obstetrics (+) Pregnancy                             Anesthesia Physical Anesthesia Plan  ASA: III  Anesthesia Plan: Epidural   Post-op Pain Management:    Induction:   PONV Risk Score and Plan: Treatment may vary due to age or medical condition  Airway Management Planned: Natural Airway  Additional Equipment:   Intra-op Plan:   Post-operative Plan:   Informed Consent: I have reviewed the patients History and Physical, chart, labs and discussed the procedure including the risks, benefits and alternatives for the proposed anesthesia with the patient or authorized representative who has indicated his/her understanding and acceptance.       Plan Discussed with: Anesthesiologist  Anesthesia Plan Comments: (Patient identified. Risks, benefits, options discussed with patient including but not limited to bleeding, infection, nerve damage, paralysis, failed block, incomplete pain control, headache, blood pressure changes, nausea, vomiting, reactions to  medication, itching, and post partum back pain. Confirmed with bedside nurse the patient's most recent platelet count. Confirmed with the patient that they are not taking any anticoagulation, have any bleeding history or any family history of bleeding disorders. Patient expressed understanding and wishes to proceed. All questions were answered. )        Anesthesia Quick Evaluation

## 2019-12-02 NOTE — Lactation Note (Addendum)
This note was copied from a baby's chart. Lactation Consultation Note  Patient Name: Shelby Paul DGUYQ'I Date: 12/02/2019 Reason for consult: Initial assessment;Term P5, 9 hour term female infant in NICU low respiratory rates. Mom with hx: GDM Per mom, she receives [facility] in [facility] and doesn't have breast pump at home, [facility] explained mom was given hand pump in her DEBP kit. Mom is experienced in BF, she BF her 1st,2nd and 4th child for 2 years each, her 4th child is now 5 years, she only BF her 3rd child for one year. LC discussed hand expression and mom easily expressed 48 mls that was put in 5 bullets that were in 10 mls increments dad took EBM to NICU and will obtain labels for infant. LC discussed how to use DEBP due infant separation ( infant being in NICU) mom knows to pump every 3 hours for 15 minutes on initial setting and can hand express afterwards if at first she doesn't see EBM when using the DEBP, mom understands it is normal if she doesn't see EBM at first.  Mom is using the DEBP due to infant separation,  to help establish and maintain her milk supply. Mom shown how to use DEBP & how to disassemble, clean, & reassemble parts. Mom knows to call RN or LC if she has any questions or concerns.  Mom made aware of O/P services, breastfeeding support groups, community resources, and our phone # for post-discharge questions.  Maternal Data Formula Feeding for Exclusion: No Has patient been taught Hand Expression?: Yes Does the patient have breastfeeding experience prior to this delivery?: Yes  Feeding    LATCH Score                   Interventions Interventions: Breast feeding basics reviewed;Breast massage;Hand express;Expressed milk;DEBP  Lactation Tools Discussed/Used Tools: Flanges Flange Size: 27 WIC Program: Yes Pump Review: Setup, frequency, and cleaning;Milk Storage Initiated by:: Vicente Serene, IBCLC Date initiated:: 12/02/19   Consult  Status Consult Status: Follow-up Date: 12/03/19 Follow-up type: In-patient    Vicente Serene 12/02/2019, 5:53 PM

## 2019-12-02 NOTE — Op Note (Signed)
Shelby Paul   PROCEDURE DATE: 12/02/2019  PREOPERATIVE DIAGNOSES: Intrauterine pregnancy at [redacted]w[redacted]d weeks gestation; non-reassuring fetal status  POSTOPERATIVE DIAGNOSES: The same  PROCEDURE: STAT Primary Low Transverse Cesarean Section  SURGEON: Dr. Tinnie Gens, MD           Dr. Lynnda Shields, MD           Dr. Scheryl Darter, MD   ANESTHESIOLOGY TEAM: Anesthesiologist: Lucretia Kern, MD; Elmer Picker, MD CRNA: Shanon Payor, CRNA; Rica Records, CRNA; Sterling, Summer Set M, CRNA  INDICATIONS: Shelby Paul is a 39 y.o. [phone] at [redacted]w[redacted]d here for cesarean section secondary to the indications listed under preoperative diagnoses; Due to emergency situation, a full written consent process was not obtained. The risks, benefits, complications, treatment options, and expected outcomes were discussed with the patient while moving to the OR. The patient concurred with the proposed plan, giving verbal informed consent.  FINDINGS: Viable female infant in cephalic presentation.  Apgars 2 and 6.  Clear amniotic fluid. Intact placenta, three vessel cord.  Normal uterus, fallopian tubes and ovaries bilaterally.  ANESTHESIA: Epidural  INTRAVENOUS FLUIDS: 900 ml   ESTIMATED BLOOD LOSS: 991 ml URINE OUTPUT:  350 ml SPECIMENS: Placenta sent to pathology COMPLICATIONS: None immediate  PROCEDURE IN DETAIL:  The patient was urgently taken to the the operating room her epidural anesthesia was dosed up to surgical level and was found to be adequate. She was then placed in a dorsal supine position with a leftward tilt, prepped quickly with betadine and draped in a sterile manner.  She already had a foley catheter in her bladder from L&D.  After a timeout was performed, a Pfannenstiel skin incision was made with scalpel and carried through to the underlying layer of fascia. The fascial incision was extended bilaterally in a blunt fashion.  The fascia was separated from underlying rectus muscles  bluntly.  The rectus muscles were separated in the midline bluntly and the peritoneum was entered bluntly. Attention was turned to the lower uterine segment where a low transverse hysterotomy was made with a scalpel and extended bilaterally bluntly.  The infant was successfully delivered, the cord was clamped and cut and the infant was handed over to awaiting neonatology team. Incision to delivery time was less than two minutes.  The placenta was delivered intact and had a three-vessel cord. The uterus was then cleared of clot and debris.  The hysterotomy was closed with 0 Monocryl in a running locked fashion, and an imbricating layer was also placed with 0 Monocryl. The pelvis was cleared of all clot and debris. Hemostasis was confirmed on all surfaces. The fascia was then closed using 0 Vicryl in a running fashion.  The subcutaneous layer was irrigated and closure was performed with 0 plain suture.  Skin closed using 4-0 Vicryl on a Keith needle.  Subcutaneous tissue infused with 30cc 0.25% Marcaine.  Steri strips applied, followed by pressure dressing.  All instrument, needle and lap counts were correct x 2.  Patient was awake and taken to PACU stable.  Infant remained with mom in couplet care, stable.   Sheila Oats, MD OB Fellow, [facility] 12/02/2019 9:13 AM

## 2019-12-02 NOTE — Discharge Instructions (Signed)

## 2019-12-02 NOTE — Anesthesia Procedure Notes (Signed)
Epidural Patient location during procedure: OB Start time: 12/02/2019 4:00 AM End time: 12/02/2019 4:10 AM  Staffing Anesthesiologist: Elmer Picker, MD Performed: anesthesiologist   Preanesthetic Checklist Completed: patient identified, IV checked, risks and benefits discussed, monitors and equipment checked, pre-op evaluation and timeout performed  Epidural Patient position: sitting Prep: DuraPrep and site prepped and draped Patient monitoring: continuous pulse ox, blood pressure, heart rate and cardiac monitor Approach: midline Location: L3-L4 Injection technique: LOR air  Needle:  Needle type: Tuohy  Needle gauge: 17 G Needle length: 9 cm Needle insertion depth: 5 cm Catheter type: closed end flexible Catheter size: 19 Gauge Catheter at skin depth: 11 cm Test dose: negative  Assessment Sensory level: T8 Events: blood not aspirated, injection not painful, no injection resistance, no paresthesia and negative IV test  Additional Notes Patient identified. Risks/Benefits/Options discussed with patient including but not limited to bleeding, infection, nerve damage, paralysis, failed block, incomplete pain control, headache, blood pressure changes, nausea, vomiting, reactions to medication both or allergic, itching and postpartum back pain. Confirmed with bedside nurse the patient's most recent platelet count. Confirmed with patient that they are not currently taking any anticoagulation, have any bleeding history or any family history of bleeding disorders. Patient expressed understanding and wished to proceed. All questions were answered. Sterile technique was used throughout the entire procedure. Please see nursing notes for vital signs. Test dose was given through epidural catheter and negative prior to continuing to dose epidural or start infusion. Warning signs of high block given to the patient including shortness of breath, tingling/numbness in hands, complete motor block,  or any concerning symptoms with instructions to call for help. Patient was given instructions on fall risk and not to get out of bed. All questions and concerns addressed with instructions to call with any issues or inadequate analgesia.  Reason for block:procedure for pain

## 2019-12-02 NOTE — Progress Notes (Signed)
Pt OOB to bathroom. Peri care performed. OB assessment WNL. Carmelina Dane, RN

## 2019-12-03 ENCOUNTER — Encounter (HOSPITAL_COMMUNITY): Payer: Self-pay | Admitting: Obstetrics and Gynecology

## 2019-12-03 LAB — CBC
HCT: 25.7 % — ABNORMAL LOW (ref 36.0–46.0)
Hemoglobin: 8.7 g/dL — ABNORMAL LOW (ref 12.0–15.0)
MCH: 29.9 pg (ref 26.0–34.0)
MCHC: 33.9 g/dL (ref 30.0–36.0)
MCV: 88.3 fL (ref 80.0–100.0)
Platelets: 99 10*3/uL — ABNORMAL LOW (ref 150–400)
RBC: 2.91 MIL/uL — ABNORMAL LOW (ref 3.87–5.11)
RDW: 12.4 % (ref 11.5–15.5)
WBC: 11.6 10*3/uL — ABNORMAL HIGH (ref 4.0–10.5)
nRBC: 0 % (ref 0.0–0.2)

## 2019-12-03 NOTE — Plan of Care (Signed)
Postpartum course moving along.Pain well controlled,voids without difficulty,has yet to pass flatus but bowel sounds active all 4 quads.walks to NICU and tolerates well.

## 2019-12-03 NOTE — Lactation Note (Signed)
This note was copied from a baby's chart. Lactation Consultation Note  Patient Name: Shelby Paul [id number] Date: 12/03/2019 Reason for consult: Follow-up assessment;Term;NICU baby   Visited with mom of a 60 hours old FT NICU female, she's a P5 and told LC she has stopped pumping because "they're not giving my baby any of my milk". NICU team explained to mom that due to the fact that baby is in respiratory distress, oral feedings were withhold at this time. Mom has not pumped since yesterday; she got 48 ml of EBM.  LC stressed to mom the importance of consistent pumping to protect her supply, even though the baby cannot drink the milk now, the NICU staff will save it/freeze it and give it to the baby once he's ready to take oral feeds. Mom voiced understanding and asked LC to help her with pumping, she's using # 27 flanges, LC assisted mom, she started pumping during [facility] consultation, praised her for her efforts.  Feeding plan:  1. Encouraged mom to pump every 3 hours; at least 8 pumping sessions/24 hours 2. Mom will continue taking her EBM to the NICU for later use  No support person in mom's room at the time of [facility] consultation. Mom reported all questions and concerns were answered, she's aware of LC OP services and will call PRN.   Maternal Data    Feeding    LATCH Score                   Interventions Interventions: Breast feeding basics reviewed;DEBP  Lactation Tools Discussed/Used Tools: Pump;Flanges Flange Size: 27 Breast pump type: Double-Electric Breast Pump   Consult Status Consult Status: Follow-up Date: 12/04/19 Follow-up type: In-patient    Shelby Paul 12/03/2019, 1:50 PM

## 2019-12-03 NOTE — Progress Notes (Signed)
Subjective: Postpartum Day 1: Cesarean Delivery Patient reports no problems this morning. Pain controlled. Tolerating diet. Ambulating to restroom without problems.    Objective: Vital signs in last 24 hours: Temp:  [97.6 F (36.4 C)-98.2 F (36.8 C)] 98.1 F (36.7 C) (10/24 0720) Pulse Rate:  [59-76] 70 (10/24 0720) Resp:  [15-18] 17 (10/24 0720) BP: (99-123)/(60-80) 104/65 (10/24 0720) SpO2:  [97 %-100 %] 100 % (10/24 0720)  Physical Exam:  General: alert Lochia: appropriate Uterine Fundus: firm Incision: healing well DVT Evaluation: No evidence of DVT seen on physical exam.  Recent Labs    12/02/19 1038 12/03/19 0503  HGB 12.3 8.7*  HCT 36.9 25.7*    Assessment/Plan: Status post Cesarean section. Doing well postoperatively.  Continue current care.  Hermina Staggers 12/03/2019, 9:46 AM

## 2019-12-04 ENCOUNTER — Encounter: Payer: Medicaid Other | Admitting: Obstetrics and Gynecology

## 2019-12-04 MED ORDER — FERROUS SULFATE 325 (65 FE) MG PO TABS
325.0000 mg | ORAL_TABLET | Freq: Every day | ORAL | Status: DC
  Administered 2019-12-04 – 2019-12-05 (×2): 325 mg via ORAL
  Filled 2019-12-04 (×2): qty 1

## 2019-12-04 NOTE — Plan of Care (Signed)
Not passing flatus. Encouraged frequent ambulation and hot beverages to facilitate passing gas.

## 2019-12-04 NOTE — Progress Notes (Signed)
Subjective: Postpartum Day 2: Cesarean Delivery Patient reports incisional pain.  Low back pain and stiffness  Objective: Vital signs in last 24 hours: Temp:  [98 F (36.7 C)-98.7 F (37.1 C)] 98.3 F (36.8 C) (10/25 0754) Pulse Rate:  [74-88] 77 (10/25 0754) Resp:  [15-17] 16 (10/25 0754) BP: (96-114)/(59-72) 112/72 (10/25 0754) SpO2:  [100 %] 100 % (10/25 0754)  Physical Exam:  General: alert, cooperative and no distress Lochia: appropriate Uterine Fundus: firm Incision: healing well, no significant drainage DVT Evaluation: No evidence of DVT seen on physical exam.  Recent Labs    12/02/19 1038 12/03/19 0503  HGB 12.3 8.7*  HCT 36.9 25.7*    Assessment/Plan: Status post Cesarean section. Doing well postoperatively.  Baby is in the NICU and is not ready for discharge.  Scheryl Darter 12/04/2019, 9:38 AM

## 2019-12-04 NOTE — Lactation Note (Signed)
This note was copied from a baby's chart. Lactation Consultation Note  Patient Name: Shelby Paul [id number] Date: 12/04/2019 Reason for consult: Follow-up assessment;Difficult latch;NICU baby;Term  RN requested LC assistance with baby's first time breastfeeding. Baby term, 31 hrs old and has been NPO due to respiratory distress.  P5 Mom has pumped one time for 48 ml.    Breasts are soft, not engorged, but areola edematous and nipples erect.  Placed baby STS on Mom's chest and he was rooting actively.  Baby placed in football hold on right breast.  Mom wanting to latch baby to nipple only.  Assisted Mom to support her breast and support baby's head from ear to ear.  Baby trying to latch, but unable to sustain depth on the breast (partly due to edema of areola).    Initiated a 20 mm nipple shield to help with latch, showing Mom how to properly apply to breast.  Baby with several attempts, but guided Mom's hands onto breast while baby in cross cradle and baby able to sustain a deep latch to breast.  No swallowing identified but baby able to drop his jaw with sucking.  Instilled EBM into shield to entice baby, and he sucked/swallowed vigorously for 15 mins.    Baby burped and Mom able to latch baby without nipple shield.  Mom wanting to pull breast away from baby's nose, repeated instruction on how to align baby so chin is in deeper to breast and nose close but not touching.  LC did identify deep jaw extensions and an occasional swallow identified.     Mom is experience breastfeeding her 4 other children for 1-2 yrs.  Mom needing guidance on positioning baby to enable baby to attain a deep grasp of breast.  Baby is very interested in breast and lots of basic teaching reviewed.  Mom very receptive, and has her own way to latch baby.    Encouraged Mom to double pump after baby breastfeeds to support her milk supply. If baby can go to breast ad lib today, she will not have to pump.      Feeding Feeding Type: Breast Fed  LATCH Score Latch: Grasps breast easily, tongue down, lips flanged, rhythmical sucking.  Audible Swallowing: A few with stimulation  Type of Nipple: Everted at rest and after stimulation  Comfort (Breast/Nipple): Soft / non-tender  Hold (Positioning): Assistance needed to correctly position infant at breast and maintain latch.  LATCH Score: 8  Interventions Interventions: Breast feeding basics reviewed;Assisted with latch;Skin to skin;Breast massage;Hand express;Pre-pump if needed;Adjust position;Support pillows;Position options;Expressed milk;DEBP  Lactation Tools Discussed/Used Tools: Pump Nipple shield size: 20 Breast pump type: Double-Electric Breast Pump   Consult Status Consult Status: Follow-up Date: 12/05/19 Follow-up type: In-patient    Judee Clara 12/04/2019, 11:35 AM

## 2019-12-04 NOTE — Progress Notes (Signed)
Patient screened out for psychosocial assessment since none of the following apply: °Psychosocial stressors documented in mother or baby's chart °Gestation less than 32 weeks °Code at delivery  °Infant with anomalies °Please contact the Clinical Social Worker if specific needs arise, by MOB's request, or if MOB scores greater than 9/yes to question 10 on Edinburgh Postpartum Depression Screen. ° °Tranika Scholler Boyd-Gilyard, MSW, LCSW °Clinical Social Work °[phone] °  °

## 2019-12-05 LAB — SURGICAL PATHOLOGY

## 2019-12-05 MED ORDER — OXYCODONE HCL 5 MG PO TABS
5.0000 mg | ORAL_TABLET | ORAL | 0 refills | Status: DC | PRN
Start: 2019-12-05 — End: 2021-01-29

## 2019-12-05 MED ORDER — IBUPROFEN 600 MG PO TABS: 600.0000 mg | ORAL_TABLET | Freq: Four times a day (QID) | ORAL | 1 refills | Status: DC | PRN

## 2019-12-05 NOTE — Lactation Note (Signed)
This note was copied from a baby's chart. Lactation Consultation Note  Patient Name: Boy Laquanna Veazey NOIBB'C Date: 12/05/2019 Reason for consult: Follow-up assessment;Term;Infant weight loss;Other (Comment) (2 % weight loss, per mom repeat Bilirubin this afternoon and per RN) Baby 77 hours old .  Baby asleep and mom resting in the bed when LC entered the room.  Per mom milk in in and the baby last fed at 1100 for 30 mins and was able to soften one side . Per mom also has pumped.  Per mom will have a pump at home. Denies soreness , sore nipple and engorgement prevention and tx reviewed.    Maternal Data    Feeding Feeding Type:  (per mom last fed at 1100 for 30 mins and baby softened the breast)  LATCH Score                   Interventions Interventions: Breast feeding basics reviewed  Lactation Tools Discussed/Used Pump Review: Milk Storage   Consult Status Consult Status: Complete Date: 12/05/19    Kathrin Greathouse 12/05/2019, 1:47 PM

## 2019-12-06 ENCOUNTER — Ambulatory Visit: Payer: Self-pay

## 2019-12-06 NOTE — Lactation Note (Signed)
This note was copied from a baby's chart. Lactation Consultation Note  Patient Name: Shelby Paul Date: 12/06/2019 Reason for consult: Follow-up assessment  LC to room for f/u visit. Mom and baby may d/c today. Mom is feeding at the breast and pumping to relieve engorgement. Mom is also feeding EBM using bottle p bf prn. Reviewed feeding expectations and demonstrated use of hand pump. Parents offered opportunity to ask questions. All concerns addressed. Will plan f/u visit prn.   Interventions Interventions: Breast feeding basics reviewed;Expressed milk;Hand pump;Ice  Lactation Tools Discussed/Used     Consult Status Consult Status: PRN Date: 12/07/19 Follow-up type: In-patient    Elder Negus 12/06/2019, 8:45 AM

## 2019-12-11 ENCOUNTER — Ambulatory Visit (INDEPENDENT_AMBULATORY_CARE_PROVIDER_SITE_OTHER): Payer: Medicaid Other

## 2019-12-11 ENCOUNTER — Other Ambulatory Visit: Payer: Self-pay

## 2019-12-11 VITALS — BP 130/80 | HR 80

## 2019-12-11 DIAGNOSIS — Z4889 Encounter for other specified surgical aftercare: Secondary | ICD-10-CM

## 2019-12-11 NOTE — Progress Notes (Signed)
Pt presents for incision check s/p CS-vertical on 12/02/19. The wound is CDI Pt c/o pain at incision site. Pt not taking Oxycodone d/t fear of constipation. Pt to take Oxycodone as directed when pain is severe along with a stool softener.   Home wound care instructions are provided  Keep upcoming pp 2 gtt - pt reminded to fast  Keep upcoming routine pp visit

## 2019-12-14 NOTE — Progress Notes (Signed)
Patient ID: Shelby Paul, female   DOB: [birth year], 39 y.o.   MRN: [id number] Patient was assessed and managed by nursing staff during this encounter. I have reviewed the chart and agree with the documentation and plan. I have also made any necessary editorial changes.  Scheryl Darter, MD 12/14/2019 11:11 AM

## 2019-12-28 ENCOUNTER — Telehealth: Payer: Self-pay

## 2019-12-28 NOTE — Telephone Encounter (Signed)
Received call from patient- she reports falling down the stairs yesterday. She reports not feeling well and her body is shaking all over. I have advised her to follow up with urgent care to check out.

## 2020-01-15 ENCOUNTER — Ambulatory Visit (INDEPENDENT_AMBULATORY_CARE_PROVIDER_SITE_OTHER): Payer: Medicaid Other | Admitting: Advanced Practice Midwife

## 2020-01-15 ENCOUNTER — Other Ambulatory Visit: Payer: Medicaid Other

## 2020-01-15 ENCOUNTER — Other Ambulatory Visit: Payer: Self-pay

## 2020-01-15 DIAGNOSIS — Z3009 Encounter for other general counseling and advice on contraception: Secondary | ICD-10-CM

## 2020-01-15 DIAGNOSIS — Z8632 Personal history of gestational diabetes: Secondary | ICD-10-CM | POA: Insufficient documentation

## 2020-01-15 DIAGNOSIS — Z98891 History of uterine scar from previous surgery: Secondary | ICD-10-CM | POA: Insufficient documentation

## 2020-01-15 NOTE — Progress Notes (Signed)
..   Post Partum Visit Note  Shelby Paul is a 39 y.o. [phone] female who presents for a postpartum visit. She is 6 weeks postpartum following a primary cesarean section.  I have fully reviewed the prenatal and intrapartum course. The delivery was at 39.6 gestational weeks.  Anesthesia: spinal. Postpartum course has been good. Baby is doing well. Baby is feeding by breast. Bleeding no bleeding. Bowel function is normal. Bladder function is normal. Patient is not sexually active. Contraception method is none. Postpartum depression screening: negative.   The pregnancy intention screening data noted above was reviewed. Potential methods of contraception were discussed. The patient elected to proceed with Withdrawal or Other Method.    Edinburgh Postnatal Depression Scale - 01/15/20 0846      Edinburgh Postnatal Depression Scale:  In the Past 7 Days   I have been able to laugh and see the funny side of things. 0    I have looked forward with enjoyment to things. 0    I have blamed myself unnecessarily when things went wrong. 0    I have been anxious or worried for no good reason. 0    I have felt scared or panicky for no good reason. 0    Things have been getting on top of me. 0    I have been so unhappy that I have had difficulty sleeping. 0    I have felt sad or miserable. 1    I have been so unhappy that I have been crying. 0    The thought of harming myself has occurred to me. 0    Edinburgh Postnatal Depression Scale Total 1            The following portions of the patient's history were reviewed and updated as appropriate: allergies, current medications, past family history, past medical history, past social history, past surgical history and problem list.  Review of Systems Pertinent items noted in HPI and remainder of comprehensive ROS otherwise negative.    Objective:  BP 113/75   Pulse (!) 52   Ht 5\' 6"  (1.676 m)   Wt 192 lb 3.2 oz (87.2 kg)   LMP 02/26/2019    Breastfeeding Yes   BMI 31.02 kg/m    VS reviewed, nursing note reviewed,  Constitutional: well developed, well nourished, no distress HEENT: normocephalic CV: normal rate Pulm/chest wall: normal effort Abdomen: soft Neuro: alert and oriented x 3 Skin: warm, dry. Incision well approximated with no edema, erythema, or exudate. Psych: affect normal   Assessment:   1. Postpartum care following cesarean delivery --Pt doing well, mild incision pain if she is very active, otherwise no pain.  2. Encounter for counseling regarding contraception --Discussed LARCs as most effective forms of birth control.  Discussed benefits/risks of other methods.  Pt desires to continue lactational amenorrhea for now, will call the office for contraceptive visit at a later date.  Considering Liletta IUD.   Plan:   Essential components of care per ACOG recommendations:  1.  Mood and well being: Patient with negative depression screening today. Reviewed local resources for support.  - Patient does not use tobacco.  - hx of drug use? No    2. Infant care and feeding:  -Patient currently breastmilk feeding? Yes  If breastmilk feeding discussed return to work and pumping. If needed, patient was provided letter for work to allow for every 2-3 hr pumping breaks, and to be granted a private location to express breastmilk and refrigerated  area to store breastmilk. Reviewed importance of draining breast regularly to support lactation. -Social determinants of health (SDOH) reviewed in EPIC. No concerns  3. Sexuality, contraception and birth spacing - Patient does not want a pregnancy in the next year.   - Reviewed forms of contraception in tiered fashion. Patient desired to continue lactational amenorrhea, will call office for contraceptive visit - Discussed birth spacing of 18 months  4. Sleep and fatigue -Encouraged family/partner/community support of 4 hrs of uninterrupted sleep to help with mood and  fatigue  5. Physical Recovery  - Discussed patients delivery and complications - Patient has urinary incontinence? No  - Patient is safe to resume physical and sexual activity  6.  Health Maintenance - Last pap smear was ordered but not done in pregnancy.   --Pt needs Pap unless obtained by another provider   Sharen Counter, CNM [facility], [facility]

## 2020-01-16 LAB — GLUCOSE TOLERANCE, 2 HOURS
Glucose, 2 hour: 119 mg/dL (ref 65–139)
Glucose, GTT - Fasting: 90 mg/dL (ref 65–99)

## 2021-01-14 ENCOUNTER — Ambulatory Visit: Payer: No Typology Code available for payment source | Admitting: Nurse Practitioner

## 2021-01-28 ENCOUNTER — Encounter (HOSPITAL_COMMUNITY): Payer: Self-pay | Admitting: Emergency Medicine

## 2021-01-28 ENCOUNTER — Other Ambulatory Visit: Payer: Self-pay

## 2021-01-28 ENCOUNTER — Ambulatory Visit (HOSPITAL_COMMUNITY)
Admission: EM | Admit: 2021-01-28 | Discharge: 2021-01-28 | Disposition: A | Discharge status: Home / Self Care | Payer: Medicaid Other | Attending: Family Medicine | Admitting: Family Medicine

## 2021-01-28 DIAGNOSIS — Z3A00 Weeks of gestation of pregnancy not specified: Secondary | ICD-10-CM | POA: Diagnosis not present

## 2021-01-28 DIAGNOSIS — R102 Pelvic and perineal pain: Secondary | ICD-10-CM | POA: Diagnosis not present

## 2021-01-28 DIAGNOSIS — O0001 Abdominal pregnancy with intrauterine pregnancy: Secondary | ICD-10-CM | POA: Diagnosis not present

## 2021-01-28 DIAGNOSIS — J029 Acute pharyngitis, unspecified: Secondary | ICD-10-CM | POA: Insufficient documentation

## 2021-01-28 DIAGNOSIS — Z20822 Contact with and (suspected) exposure to covid-19: Secondary | ICD-10-CM | POA: Diagnosis not present

## 2021-01-28 DIAGNOSIS — R109 Unspecified abdominal pain: Secondary | ICD-10-CM | POA: Diagnosis not present

## 2021-01-28 DIAGNOSIS — O26899 Other specified pregnancy related conditions, unspecified trimester: Secondary | ICD-10-CM | POA: Diagnosis not present

## 2021-01-28 DIAGNOSIS — J069 Acute upper respiratory infection, unspecified: Secondary | ICD-10-CM

## 2021-01-28 DIAGNOSIS — Z3201 Encounter for pregnancy test, result positive: Secondary | ICD-10-CM

## 2021-01-28 LAB — POCT URINALYSIS DIPSTICK, ED / UC
Bilirubin Urine: NEGATIVE
Glucose, UA: NEGATIVE mg/dL
Hgb urine dipstick: NEGATIVE
Ketones, ur: 40 mg/dL — AB
Leukocytes,Ua: NEGATIVE
Nitrite: NEGATIVE
Protein, ur: NEGATIVE mg/dL
Specific Gravity, Urine: 1.015 (ref 1.005–1.030)
Urobilinogen, UA: 0.2 mg/dL (ref 0.0–1.0)
pH: 7 (ref 5.0–8.0)

## 2021-01-28 LAB — POC INFLUENZA A AND B ANTIGEN (URGENT CARE ONLY)
INFLUENZA A ANTIGEN, POC: NEGATIVE
INFLUENZA B ANTIGEN, POC: NEGATIVE

## 2021-01-28 LAB — SARS CORONAVIRUS 2 (TAT 6-24 HRS): SARS Coronavirus 2: NEGATIVE

## 2021-01-28 LAB — POC URINE PREG, ED: Preg Test, Ur: POSITIVE — AB

## 2021-01-28 LAB — POCT RAPID STREP A, ED / UC: Streptococcus, Group A Screen (Direct): NEGATIVE

## 2021-01-28 NOTE — ED Provider Notes (Signed)
[facility]    CSN: [id number] Arrival date & time: 01/28/21  [id number]      History   Chief Complaint Chief Complaint  Patient presents with   Sore Throat   Generalized Body Aches   Pelvic Pain    HPI Shelby Paul is a 40 y.o. female.    Sore Throat  Pelvic Pain  Here for a 24-hour history of sore throat, malaise, and body aches.  She has not had any cough.  No vomiting or diarrhea.  She also notes a 4266-month history of suprapubic pain mostly at night and not really ever in the day.  She has had occasional dysuria.  No hematuria or blood in the stool.  She denies constipation.  Last menstrual period she notes 1 in October 2021.  Then she had some bleeding sometime in the summer 2020 to July or August.  Past Medical History:  Diagnosis Date   Anxiety    Depression    Gallstones 06/2011   Pancreatitis 06/2011    Patient Active Problem List   Diagnosis Date Noted   History of gestational diabetes 01/15/2020   History of cesarean section 01/15/2020   Cesarean delivery delivered 12/02/2019   History of vitamin D deficiency 09/13/2019    Past Surgical History:  Procedure Laterality Date   CESAREAN SECTION  12/02/2019   Procedure: CESAREAN SECTION;  Surgeon: Reva BoresPratt, Tanya S, MD;  Location: [facility];  Service: Obstetrics;;   NO PAST SURGERIES      OB History     Gravida  6   Para  5   Term  5   Preterm  0   AB  1   Living  5      SAB  1   IAB  0   Ectopic  0   Multiple      Live Births  5            Home Medications    Prior to Admission medications   Medication Sig Start Date End Date Taking? Authorizing Provider  cholecalciferol (VITAMIN D3) 25 MCG (1000 UNIT) tablet TAKE 1 TABLET BY MOUTH EVERY DAY 05/03/20   Marny LowensteinWenzel, Julie N, PA-C  ibuprofen (ADVIL) 600 MG tablet Take 1 tablet (600 mg total) by mouth every 6 (six) hours as needed. Patient not taking: Reported on 01/15/2020 12/05/19   Adam PhenixArnold, James G, MD  oxyCODONE (OXY  IR/ROXICODONE) 5 MG immediate release tablet Take 1-2 tablets (5-10 mg total) by mouth every 4 (four) hours as needed for moderate pain. Patient not taking: Reported on 12/11/2019 12/05/19   Adam PhenixArnold, James G, MD  Prenatal Vit-Fe Fumarate-FA (PREPLUS) 27-1 MG TABS Take 1 tablet by mouth daily. 08/16/19   Raelyn Moraawson, Rolitta, CNM    Family History Family History  Problem Relation Age of Onset   Diabetes Mother    Hypertension Mother    Diabetes Father     Social History Social History   Tobacco Use   Smoking status: Never   Smokeless tobacco: Never  Vaping Use   Vaping Use: Never used  Substance Use Topics   Alcohol use: No   Drug use: No     Allergies   Patient has no known allergies.   Review of Systems Review of Systems  Genitourinary:  Positive for pelvic pain.    Physical Exam Triage Vital Signs ED Triage Vitals  Enc Vitals Group     BP 01/28/21 0926 126/82     Pulse Rate 01/28/21  0926 98     Resp 01/28/21 0926 17     Temp 01/28/21 0926 99.6 F (37.6 C)     Temp Source 01/28/21 0926 Oral     SpO2 01/28/21 0926 98 %     Weight --      Height --      Head Circumference --      Peak Flow --      Pain Score 01/28/21 0925 9     Pain Loc --      Pain Edu? --      Excl. in GC? --    No data found.  Updated Vital Signs BP 126/82    Pulse 98    Temp 99.6 F (37.6 C) (Oral)    Resp 17    SpO2 98%    Breastfeeding No   Visual Acuity Right Eye Distance:   Left Eye Distance:   Bilateral Distance:    Right Eye Near:   Left Eye Near:    Bilateral Near:     Physical Exam Vitals reviewed.  Constitutional:      General: She is not in acute distress.    Appearance: She is well-developed. She is not toxic-appearing.  HENT:     Head: Atraumatic.     Right Ear: Tympanic membrane and ear canal normal.     Left Ear: Tympanic membrane normal.     Nose: No congestion.     Mouth/Throat:     Mouth: Mucous membranes are moist.     Pharynx: Oropharyngeal exudate (white  mucus in posterior OP) present. No posterior oropharyngeal erythema.  Eyes:     Extraocular Movements: Extraocular movements intact.     Conjunctiva/sclera: Conjunctivae normal.     Pupils: Pupils are equal, round, and reactive to light.  Cardiovascular:     Rate and Rhythm: Normal rate and regular rhythm.     Heart sounds: No murmur heard. Pulmonary:     Effort: Pulmonary effort is normal.     Breath sounds: Normal breath sounds. No stridor. No wheezing or rhonchi.  Abdominal:     Palpations: Abdomen is soft.     Tenderness: There is no abdominal tenderness.     Comments: Uterine fundus palpable at the umbilicus   Musculoskeletal:     Cervical back: Neck supple.  Lymphadenopathy:     Cervical: No cervical adenopathy.  Skin:    Coloration: Skin is not jaundiced or pale.  Neurological:     General: No focal deficit present.     Mental Status: She is alert and oriented to person, place, and time.  Psychiatric:        Behavior: Behavior normal.     UC Treatments / Results  Labs (all labs ordered are listed, but only abnormal results are displayed) Labs Reviewed  POC URINE PREG, ED - Abnormal; Notable for the following components:      Result Value   Preg Test, Ur POSITIVE (*)    All other components within normal limits  POCT URINALYSIS DIPSTICK, ED / UC - Abnormal; Notable for the following components:   Ketones, ur 40 (*)    All other components within normal limits  URINE CULTURE  CULTURE, GROUP A STREP ([facility])  SARS CORONAVIRUS 2 (TAT 6-24 HRS)  POCT RAPID STREP A, ED / UC  POC INFLUENZA A AND B ANTIGEN (URGENT CARE ONLY)    EKG   Radiology No results found.  Procedures Procedures (including critical care time)  Medications Ordered in  UC Medications - No data to display  Initial Impression / Assessment and Plan / UC Course  I have reviewed the triage vital signs and the nursing notes.  Pertinent labs & imaging results that were available during my care  of the patient were reviewed by me and considered in my medical decision making (see chart for details).     UPT positive; I suspect [id number] weeks, but uncertain dates. Strep neg; flu neg. UA clear. Will send c/s since pregnant and having some pain. Throat c/s too. COVID swab as she is dc'd, since higher risk for complications with her being pregnant. She does have an obgyn  Final Clinical Impressions(s) / UC Diagnoses   Final diagnoses:  Viral upper respiratory tract infection  Abdominal pregnancy with intrauterine pregnancy     Discharge Instructions      Your pregnancy test was positive, by exam you are possibly 20 weeks or so. See your gyn soon.   Your strep and flu tests were negative. Urinalysis was clear. Culture will be sent for the urine to test further.  COVID swab today will result in the next 24 hours. Staff will call you if positive. And you should quarantine 5 days if positive.  Tylenol is safe to take for pain or fever. Dayquil is safe to take for congestion     ED Prescriptions   None    PDMP not reviewed this encounter.   Barrett Henle, MD 01/28/21 [phone]

## 2021-01-28 NOTE — Discharge Instructions (Addendum)
Your pregnancy test was positive, by exam you are possibly 20 weeks or so. See your gyn soon.   Your strep and flu tests were negative. Urinalysis was clear. Culture will be sent for the urine to test further.  COVID swab today will result in the next 24 hours. Staff will call you if positive. And you should quarantine 5 days if positive.  Tylenol is safe to take for pain or fever. Dayquil is safe to take for congestion

## 2021-01-28 NOTE — ED Triage Notes (Addendum)
Pt is present today with sore throat and  body aches. Pt sx started yesterday.    Pt states that she is also having pelvic pain. Pt states the pain started two months.   Pt would also like to be tested for pregnancy. Pt states that she cannot remember her last menstrual cycle after her last birth

## 2021-01-29 ENCOUNTER — Other Ambulatory Visit: Payer: Self-pay

## 2021-01-29 ENCOUNTER — Inpatient Hospital Stay (HOSPITAL_COMMUNITY)
Admission: AD | Admit: 2021-01-29 | Discharge: 2021-01-29 | Disposition: A | Discharge status: Home / Self Care | Payer: Medicaid Other | Attending: Obstetrics and Gynecology | Admitting: Obstetrics and Gynecology

## 2021-01-29 ENCOUNTER — Encounter (HOSPITAL_COMMUNITY): Payer: Self-pay | Admitting: Obstetrics and Gynecology

## 2021-01-29 DIAGNOSIS — O98512 Other viral diseases complicating pregnancy, second trimester: Secondary | ICD-10-CM | POA: Diagnosis not present

## 2021-01-29 DIAGNOSIS — Z3A00 Weeks of gestation of pregnancy not specified: Secondary | ICD-10-CM | POA: Diagnosis not present

## 2021-01-29 DIAGNOSIS — R102 Pelvic and perineal pain: Secondary | ICD-10-CM | POA: Insufficient documentation

## 2021-01-29 DIAGNOSIS — O0932 Supervision of pregnancy with insufficient antenatal care, second trimester: Secondary | ICD-10-CM | POA: Insufficient documentation

## 2021-01-29 DIAGNOSIS — B349 Viral infection, unspecified: Secondary | ICD-10-CM | POA: Insufficient documentation

## 2021-01-29 DIAGNOSIS — O26892 Other specified pregnancy related conditions, second trimester: Secondary | ICD-10-CM | POA: Diagnosis not present

## 2021-01-29 DIAGNOSIS — Z3492 Encounter for supervision of normal pregnancy, unspecified, second trimester: Secondary | ICD-10-CM

## 2021-01-29 LAB — URINE CULTURE: Culture: NO GROWTH

## 2021-01-29 LAB — WET PREP, GENITAL
Clue Cells Wet Prep HPF POC: NONE SEEN
Sperm: NONE SEEN
Trich, Wet Prep: NONE SEEN
WBC, Wet Prep HPF POC: >=10 — AB (ref <10)
Yeast Wet Prep HPF POC: NONE SEEN

## 2021-01-29 MED ORDER — PREPLUS 27-1 MG PO TABS
1.0000 | ORAL_TABLET | Freq: Every day | ORAL | 13 refills | Status: DC
Start: 2021-01-29 — End: 2021-02-27

## 2021-01-29 MED ORDER — CYCLOBENZAPRINE HCL 10 MG PO TABS: 10.0000 mg | ORAL_TABLET | Freq: Two times a day (BID) | ORAL | 0 refills | Status: DC | PRN

## 2021-01-29 MED ORDER — CYCLOBENZAPRINE HCL 5 MG PO TABS
10.0000 mg | ORAL_TABLET | Freq: Once | ORAL | Status: AC
  Administered 2021-01-29: 13:00:00 10 mg via ORAL
  Filled 2021-01-29: qty 2

## 2021-01-29 MED ORDER — ACETAMINOPHEN 325 MG PO TABS: 650.0000 mg | ORAL_TABLET | ORAL | 0 refills | Status: AC | PRN

## 2021-01-29 MED ORDER — ACETAMINOPHEN 500 MG PO TABS
1000.0000 mg | ORAL_TABLET | Freq: Once | ORAL | Status: AC
  Administered 2021-01-29: 13:00:00 1000 mg via ORAL
  Filled 2021-01-29: qty 2

## 2021-01-29 NOTE — Discharge Instructions (Signed)

## 2021-01-29 NOTE — MAU Note (Addendum)
...  Shelby Paul is a 40 y.o. at Unknown here in MAU reporting: Pelvic pain that has been occurring for two months and states it is worse with activity. She states it also interferes with her sleep at night and states she is barely sleeping due to the pain. She states as of yesterday she began feeling vaginal pressure as if "the baby wants to come out" and also started having body aches. She states her throat is so sore this morning and she cannot seem to get any relief. She has tried ginger, honey, and lemon.   Fundal Height measurement approximately 16-17 weeks. FHR doppler 161.  Was seen at Urgent Care yesterday. Negative for Flu A&B, COVID, and strep.  Unsure of LMP. She states her LMP that she is aware of was last year in October. She states she may of had a period in June or August but is unsure. Patient denies feeling any fetal movement.  Lab orders placed from triage: UA

## 2021-01-29 NOTE — MAU Provider Note (Signed)
History     CSN: [id number]  Arrival date and time: 01/29/21 1210   Event Date/Time   First Provider Initiated Contact with Patient 01/29/21 1354      Chief Complaint  Patient presents with   Vaginal Pain   Generalized Body Aches   HPI Shelby Paul is a 40 y.o. [id number] in second trimester by uncertain LMP. She presents with general flu-like symptoms including intermittent low grade fever, muscle aches and fatigue. These are recurrent problems, for which patient was evaluated at [facility] urgent Care yesterday morning. She states her generalized body aches and fatigue are still a problem. She denies cough, fever, chest pain, SOB, weakness and syncope.   Patient also c/o recurrent pelvic pain, onset about two months ago. Pain is aggravated by walking and standing for prolonged periods of time. Patient has tried management with Ibuprofen but has not experienced relief. 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 She has not taken medication or tried other treatments for this complaint.  Patient has not initiated prenatal care and is unsure of her dating. She denies abdominal pain, vaginal bleeding, abnormal vaginal discharge.  OB History     Gravida  7   Para  5   Term  5   Preterm  0   AB  1   Living  5      SAB  1   IAB  0   Ectopic  0   Multiple      Live Births  5           Past Medical History:  Diagnosis Date   Anxiety    Depression    Gallstones 06/2011   Pancreatitis 06/2011    Past Surgical History:  Procedure Laterality Date   CESAREAN SECTION  12/02/2019   Procedure: CESAREAN SECTION;  Surgeon: Reva Bores, MD;  Location: [facility];  Service: Obstetrics;;    Family History  Problem Relation Age of Onset   Diabetes Mother    Hypertension Mother    Diabetes Father     Social History   Tobacco Use   Smoking status: Never   Smokeless tobacco: Never  Vaping Use    Vaping Use: Never used  Substance Use Topics   Alcohol use: No   Drug use: No    Allergies: No Known Allergies  Medications Prior to Admission  Medication Sig Dispense Refill Last Dose   cholecalciferol (VITAMIN D3) 25 MCG (1000 UNIT) tablet TAKE 1 TABLET BY MOUTH EVERY DAY 30 tablet 1    ibuprofen (ADVIL) 600 MG tablet Take 1 tablet (600 mg total) by mouth every 6 (six) hours as needed. (Patient not taking: Reported on 01/15/2020) 30 tablet 1    oxyCODONE (OXY IR/ROXICODONE) 5 MG immediate release tablet Take 1-2 tablets (5-10 mg total) by mouth every 4 (four) hours as needed for moderate pain. (Patient not taking: Reported on 12/11/2019) 30 tablet 0    Prenatal Vit-Fe Fumarate-FA (PREPLUS) 27-1 MG TABS Take 1 tablet by mouth daily. 30 tablet 13     Review of Systems  Constitutional:  Positive for fatigue.  Musculoskeletal:  Positive for myalgias.  All other systems reviewed and are negative. Physical Exam   Blood pressure 120/81, pulse (!) 102, temperature (!) 100.4 F (38 C), temperature source Oral, resp. rate 19, SpO2 100 %, not currently breastfeeding.  Physical Exam Vitals and nursing note reviewed.  Exam conducted with a chaperone present.  Constitutional:      General: She is not in acute distress.    Appearance: Normal appearance. She is not ill-appearing.  Cardiovascular:     Rate and Rhythm: Normal rate and regular rhythm.     Pulses: Normal pulses.     Heart sounds: Normal heart sounds.  Pulmonary:     Effort: Pulmonary effort is normal.     Breath sounds: Normal breath sounds.  Abdominal:     Tenderness: There is no abdominal tenderness. There is no right CVA tenderness or left CVA tenderness.     Comments: Fundus at umbilicus, FHT by Doppler  Skin:    Capillary Refill: Capillary refill takes less than 2 seconds.  Neurological:     Mental Status: She is alert and oriented to person, place, and time.  Psychiatric:        Mood and Affect: Mood normal.         Behavior: Behavior normal.        Thought Content: Thought content normal.        Judgment: Judgment normal.    MAU Course/MDM  Procedures  --Very through workup at Urgent Care yesterday morning. Negative Flu and COVID. Discussed with patient and partner that we will not reaccomplish that workup today as she is without acute complaints or concerns. Reviewed OTC options for symptom management. Advised Tylenol for management of low grade fever  --Pertinent negatives: abdominal pain, vaginal bleeding, absent fetal movement. No indication for emergent dating ultrasound during current MAU encounter. Will schedule outpatient.  Patient Vitals for the past 24 hrs:  BP Temp Temp src Pulse Resp SpO2  01/29/21 1421 103/62 -- -- 86 -- 100 %  01/29/21 1238 120/81 -- -- (!) 102 -- 100 %  01/29/21 1232 128/85 (!) 100.4 F (38 C) Oral (!) 104 19 100 %   Results for orders placed or performed during the hospital encounter of 01/29/21 (from the past 24 hour(s))  Wet prep, genital     Status: Abnormal   Collection Time: 01/29/21  1:23 PM   Specimen: PATH Cytology Cervicovaginal Ancillary Only  Result Value Ref Range   Yeast Wet Prep HPF POC NONE SEEN NONE SEEN   Trich, Wet Prep NONE SEEN NONE SEEN   Clue Cells Wet Prep HPF POC NONE SEEN NONE SEEN   WBC, Wet Prep HPF POC >=10 (A) <10   Sperm NONE SEEN      Meds ordered this encounter  Medications   acetaminophen (TYLENOL) tablet 1,000 mg   cyclobenzaprine (FLEXERIL) tablet 10 mg   Prenatal Vit-Fe Fumarate-FA (PREPLUS) 27-1 MG TABS    Sig: Take 1 tablet by mouth daily.    Dispense:  30 tablet    Refill:  13    Order Specific Question:   Supervising Provider    Answer:   CONSTANT, PEGGY [[id number]]   acetaminophen (TYLENOL) 325 MG tablet    Sig: Take 2 tablets (650 mg total) by mouth every 4 (four) hours as needed for moderate pain.    Dispense:  120 tablet    Refill:  0    Order Specific Question:   Supervising Provider    Answer:   CONSTANT,  PEGGY [[id number]]   cyclobenzaprine (FLEXERIL) 10 MG tablet    Sig: Take 1 tablet (10 mg total) by mouth 2 (two) times daily as needed for muscle spasms.    Dispense:  20 tablet    Refill:  0  Order Specific Question:   Supervising Provider    Answer:   CONSTANT, PEGGY [[id number]]   Assessment and Plan  --40 y.o. [id number] in second trimester based on fundal height --FHT 161 by Doppler --Viral illness, s/p evaluation at Urgent Care, no acute concerns --No prenatal care, uncertain dating, no acute concerns --Pain improved with Tylenol and Flexeril, outpatient rx for same --Discharge home in stable condition  F/U: --Outpatient dating ultrasound ordered at discharge --Patient to schedule prenatal care at her earliest convenience  Calvert Cantor, [place] 01/29/2021, 6:09 PM

## 2021-01-30 LAB — CULTURE, GROUP A STREP (THRC)

## 2021-01-30 LAB — GC/CHLAMYDIA PROBE AMP (~~LOC~~) NOT AT ARMC
Chlamydia: NEGATIVE
Comment: NEGATIVE
Comment: NORMAL
Neisseria Gonorrhea: NEGATIVE

## 2021-01-31 ENCOUNTER — Telehealth (HOSPITAL_COMMUNITY): Payer: Self-pay | Admitting: Family Medicine

## 2021-01-31 MED ORDER — AMOXICILLIN 875 MG PO TABS: 875.0000 mg | ORAL_TABLET | Freq: Two times a day (BID) | ORAL | 0 refills | Status: DC

## 2021-01-31 NOTE — Telephone Encounter (Signed)
Throat culture is pos for a few strep. Spoke with pt, she verified her dob, and I let her know I was sending Amoxil in to treat the strep. Rx sent

## 2021-02-06 ENCOUNTER — Ambulatory Visit
Admission: RE | Admit: 2021-02-06 | Discharge: 2021-02-06 | Disposition: A | Discharge status: Home / Self Care | Payer: Medicaid Other | Source: Ambulatory Visit | Attending: Advanced Practice Midwife | Admitting: Advanced Practice Midwife

## 2021-02-06 ENCOUNTER — Other Ambulatory Visit: Payer: Self-pay | Admitting: *Deleted

## 2021-02-06 ENCOUNTER — Other Ambulatory Visit (HOSPITAL_COMMUNITY): Payer: Self-pay | Admitting: Advanced Practice Midwife

## 2021-02-06 ENCOUNTER — Ambulatory Visit (HOSPITAL_BASED_OUTPATIENT_CLINIC_OR_DEPARTMENT_OTHER): Payer: Medicaid Other

## 2021-02-06 DIAGNOSIS — R102 Pelvic and perineal pain unspecified side: Secondary | ICD-10-CM

## 2021-02-06 DIAGNOSIS — O26892 Other specified pregnancy related conditions, second trimester: Secondary | ICD-10-CM | POA: Diagnosis not present

## 2021-02-06 DIAGNOSIS — Z3A17 17 weeks gestation of pregnancy: Secondary | ICD-10-CM

## 2021-02-06 DIAGNOSIS — Z3492 Encounter for supervision of normal pregnancy, unspecified, second trimester: Secondary | ICD-10-CM

## 2021-02-06 DIAGNOSIS — O09522 Supervision of elderly multigravida, second trimester: Secondary | ICD-10-CM

## 2021-02-26 ENCOUNTER — Other Ambulatory Visit: Payer: Self-pay

## 2021-02-26 ENCOUNTER — Encounter: Payer: Self-pay | Admitting: *Deleted

## 2021-02-26 ENCOUNTER — Ambulatory Visit: Payer: Medicaid Other

## 2021-02-26 ENCOUNTER — Ambulatory Visit: Payer: Medicaid Other | Admitting: *Deleted

## 2021-02-26 ENCOUNTER — Other Ambulatory Visit: Payer: Self-pay | Admitting: *Deleted

## 2021-02-26 ENCOUNTER — Ambulatory Visit: Payer: Medicaid Other | Attending: Obstetrics

## 2021-02-26 VITALS — BP 109/62 | HR 63

## 2021-02-26 DIAGNOSIS — O99212 Obesity complicating pregnancy, second trimester: Secondary | ICD-10-CM | POA: Diagnosis not present

## 2021-02-26 DIAGNOSIS — O34219 Maternal care for unspecified type scar from previous cesarean delivery: Secondary | ICD-10-CM | POA: Diagnosis not present

## 2021-02-26 DIAGNOSIS — O09292 Supervision of pregnancy with other poor reproductive or obstetric history, second trimester: Secondary | ICD-10-CM | POA: Insufficient documentation

## 2021-02-26 DIAGNOSIS — Z3A40 40 weeks gestation of pregnancy: Secondary | ICD-10-CM

## 2021-02-26 DIAGNOSIS — O0942 Supervision of pregnancy with grand multiparity, second trimester: Secondary | ICD-10-CM | POA: Insufficient documentation

## 2021-02-26 DIAGNOSIS — O0932 Supervision of pregnancy with insufficient antenatal care, second trimester: Secondary | ICD-10-CM

## 2021-02-26 DIAGNOSIS — E669 Obesity, unspecified: Secondary | ICD-10-CM

## 2021-02-26 DIAGNOSIS — O09522 Supervision of elderly multigravida, second trimester: Secondary | ICD-10-CM | POA: Diagnosis present

## 2021-02-26 DIAGNOSIS — Z363 Encounter for antenatal screening for malformations: Secondary | ICD-10-CM | POA: Insufficient documentation

## 2021-02-26 DIAGNOSIS — Z8632 Personal history of gestational diabetes: Secondary | ICD-10-CM

## 2021-02-26 DIAGNOSIS — Z3A19 19 weeks gestation of pregnancy: Secondary | ICD-10-CM | POA: Diagnosis not present

## 2021-02-26 DIAGNOSIS — Z6830 Body mass index (BMI) 30.0-30.9, adult: Secondary | ICD-10-CM

## 2021-02-26 DIAGNOSIS — Z3492 Encounter for supervision of normal pregnancy, unspecified, second trimester: Secondary | ICD-10-CM | POA: Diagnosis present

## 2021-02-27 ENCOUNTER — Ambulatory Visit (INDEPENDENT_AMBULATORY_CARE_PROVIDER_SITE_OTHER): Payer: Medicaid Other | Admitting: Obstetrics

## 2021-02-27 ENCOUNTER — Encounter: Payer: Self-pay | Admitting: Obstetrics

## 2021-02-27 VITALS — BP 108/72 | HR 82 | Wt 203.0 lb

## 2021-02-27 DIAGNOSIS — Z3480 Encounter for supervision of other normal pregnancy, unspecified trimester: Secondary | ICD-10-CM | POA: Insufficient documentation

## 2021-02-27 DIAGNOSIS — Z8632 Personal history of gestational diabetes: Secondary | ICD-10-CM

## 2021-02-27 DIAGNOSIS — Z3482 Encounter for supervision of other normal pregnancy, second trimester: Secondary | ICD-10-CM

## 2021-02-27 DIAGNOSIS — R1011 Right upper quadrant pain: Secondary | ICD-10-CM

## 2021-02-27 DIAGNOSIS — O09529 Supervision of elderly multigravida, unspecified trimester: Secondary | ICD-10-CM

## 2021-02-27 DIAGNOSIS — O26899 Other specified pregnancy related conditions, unspecified trimester: Secondary | ICD-10-CM

## 2021-02-27 DIAGNOSIS — O0990 Supervision of high risk pregnancy, unspecified, unspecified trimester: Secondary | ICD-10-CM

## 2021-02-27 DIAGNOSIS — Z98891 History of uterine scar from previous surgery: Secondary | ICD-10-CM

## 2021-02-27 DIAGNOSIS — Z3A20 20 weeks gestation of pregnancy: Secondary | ICD-10-CM

## 2021-02-27 MED ORDER — VITAFOL ULTRA 29-0.6-0.4-200 MG PO CAPS: 1.0000 | ORAL_CAPSULE | Freq: Every day | ORAL | 4 refills | Status: DC

## 2021-02-27 NOTE — Addendum Note (Signed)
Addended by: Marya Landry D on: 02/27/2021 09:54 AM   Modules accepted: Orders

## 2021-02-27 NOTE — Progress Notes (Signed)
Subjective:    Shelby Paul is being seen today for her first obstetrical visit.  This is a planned pregnancy. She is at [redacted]w[redacted]d gestation. Her obstetrical history is significant for advanced maternal age. Relationship with FOB: spouse, living together. Patient does intend to breast feed. Pregnancy history fully reviewed.  The information documented in the HPI was reviewed and verified.  Menstrual History: OB History     Gravida  7   Para  5   Term  5   Preterm  0   AB  1   Living  5      SAB  1   IAB  0   Ectopic  0   Multiple      Live Births  5           No LMP recorded (lmp unknown). Patient is pregnant.    Past Medical History:  Diagnosis Date   Anxiety    Depression    Gallstones 06/2011   Pancreatitis 06/2011    Past Surgical History:  Procedure Laterality Date   CESAREAN SECTION  12/02/2019   Procedure: CESAREAN SECTION;  Surgeon: Reva Bores, MD;  Location: [facility];  Service: Obstetrics;;    (Not in a hospital admission)  No Known Allergies  Social History   Tobacco Use   Smoking status: Never   Smokeless tobacco: Never  Substance Use Topics   Alcohol use: No    Family History  Problem Relation Age of Onset   Diabetes Mother    Hypertension Mother    Diabetes Father      Review of Systems Constitutional: negative for weight loss Gastrointestinal: positive for right upper quadrant pain.  negative for vomiting Genitourinary:negative for genital lesions and vaginal discharge and dysuria Musculoskeletal:negative for back pain Behavioral/Psych: negative for abusive relationship, depression, illegal drug usage and tobacco use    Objective:    BP 108/72    Pulse 82    Wt 203 lb (92.1 kg)    LMP  (LMP Unknown)    BMI 32.77 kg/m  General Appearance:    Alert, cooperative, no distress, appears stated age  Head:    Normocephalic, without obvious abnormality, atraumatic  Eyes:    PERRL, conjunctiva/corneas clear, EOM's intact, fundi     benign, both eyes  Ears:    Normal TM's and external ear canals, both ears  Nose:   Nares normal, septum midline, mucosa normal, no drainage    or sinus tenderness  Throat:   Lips, mucosa, and tongue normal; teeth and gums normal  Neck:   Supple, symmetrical, trachea midline, no adenopathy;    thyroid:  no enlargement/tenderness/nodules; no carotid   bruit or JVD  Back:     Symmetric, no curvature, ROM normal, no CVA tenderness  Lungs:     Clear to auscultation bilaterally, respirations unlabored  Chest Wall:    No tenderness or deformity   Heart:    Regular rate and rhythm, S1 and S2 normal, no murmur, rub   or gallop  Breast Exam:    Not examined  Abdomen:     Soft, non-tender, bowel sounds active all four quadrants,    no masses, no organomegaly.  RUQ tenderness to palpation -  Genitalia:    Not examined  Extremities:   Extremities normal, atraumatic, no cyanosis or edema  Pulses:   2+ and symmetric all extremities  Skin:   Skin color, texture, turgor normal, no rashes or lesions  Lymph nodes:  Cervical, supraclavicular, and axillary nodes normal  Neurologic:   CNII-XII intact, normal strength, sensation and reflexes    throughout      Lab Review Urine pregnancy test Labs reviewed yes Radiologic studies reviewed yes  Assessment:    Pregnancy at [redacted]w[redacted]d weeks    Plan:    1. Supervision of high risk pregnancy, antepartum Rx: - Culture, OB Urine - CBC/D/Plt+RPR+Rh+ABO+RubIgG... - Hemoglobin A1c - Prenat-Fe Poly-Methfol-FA-DHA (VITAFOL ULTRA) 29-0.6-0.4-200 MG CAPS; Take 1 capsule by mouth daily before breakfast.  Dispense: 90 capsule; Refill: 4  2. Antepartum multigravida of advanced maternal age  56. History of gestational diabetes  4. History of cesarean section - undecided about this delivery mode  5. Right upper quadrant abdominal pain affecting pregnancy Rx: - US Abdomen Limited RUQ (LIVER/GB); Future   Prenatal vitamins.  Counseling provided regarding  continued use of seat belts, cessation of alcohol consumption, smoking or use of illicit drugs; infection precautions i.e., influenza/TDAP immunizations, toxoplasmosis,CMV, parvovirus, listeria and varicella; workplace safety, exercise during pregnancy; routine dental care, safe medications, sexual activity, hot tubs, saunas, pools, travel, caffeine use, fish and methlymercury, potential toxins, hair treatments, varicose veins Weight gain recommendations per IOM guidelines reviewed: underweight/BMI< 18.5--> gain 28 - 40 lbs; normal weight/BMI 18.5 - 24.9--> gain 25 - 35 lbs; overweight/BMI 25 - 29.9--> gain 15 - 25 lbs; obese/BMI >30->gain  11 - 20 lbs Problem list reviewed and updated. FIRST/CF mutation testing/NIPT/QUAD SCREEN/fragile X/Ashkenazi Jewish population testing/Spinal muscular atrophy discussed: requested. Role of ultrasound in pregnancy discussed; fetal survey: requested. Amniocentesis discussed: not indicated. VBAC calculator score: VBAC consent form provided  Meds ordered this encounter  Medications   Prenat-Fe Poly-Methfol-FA-DHA (VITAFOL ULTRA) 29-0.6-0.4-200 MG CAPS    Sig: Take 1 capsule by mouth daily before breakfast.    Dispense:  90 capsule    Refill:  4   Orders Placed This Encounter  Procedures   Culture, OB Urine   US Abdomen Limited RUQ (LIVER/GB)    Standing Status:   Future    Standing Expiration Date:   02/27/2022    Order Specific Question:   Reason for Exam (SYMPTOM  OR DIAGNOSIS REQUIRED)    Answer:   Right upper quadrant pain    Order Specific Question:   Preferred imaging location?    Answer:   [facility]   CBC/D/Plt+RPR+Rh+ABO+RubIgG...   Hemoglobin A1c    Follow up in 4 weeks.  I have spent a total of 20 minutes of face-to-face time, excluding clinical staff time, reviewing notes and preparing to see patient, ordering tests and/or medications, and counseling the patient.    Brock Bad, MD 02/27/2021 9:26 AM

## 2021-02-27 NOTE — Progress Notes (Signed)
Pt states she is having left lower pain, ?round ligament.  Pt complains of gallbladder pain, pt states that she cannot eat much and has pain.

## 2021-02-28 LAB — CBC/D/PLT+RPR+RH+ABO+RUBIGG...
Antibody Screen: NEGATIVE
Basophils Absolute: 0 10*3/uL (ref 0.0–0.2)
Basos: 1 %
EOS (ABSOLUTE): 0.1 10*3/uL (ref 0.0–0.4)
Eos: 2 %
HCV Ab: <0.1 s/co ratio (ref 0.0–0.9)
HIV Screen 4th Generation wRfx: NONREACTIVE
Hematocrit: 33 % — ABNORMAL LOW (ref 34.0–46.6)
Hemoglobin: 11 g/dL — ABNORMAL LOW (ref 11.1–15.9)
Hepatitis B Surface Ag: NEGATIVE
Immature Grans (Abs): 0 10*3/uL (ref 0.0–0.1)
Immature Granulocytes: 0 %
Lymphocytes Absolute: 1.4 10*3/uL (ref 0.7–3.1)
Lymphs: 24 %
MCH: 27.8 pg (ref 26.6–33.0)
MCHC: 33.3 g/dL (ref 31.5–35.7)
MCV: 84 fL (ref 79–97)
Monocytes Absolute: 0.3 10*3/uL (ref 0.1–0.9)
Monocytes: 6 %
Neutrophils Absolute: 3.9 10*3/uL (ref 1.4–7.0)
Neutrophils: 67 %
Platelets: 141 10*3/uL — ABNORMAL LOW (ref 150–450)
RBC: 3.95 x10E6/uL (ref 3.77–5.28)
RDW: 12.8 % (ref 11.7–15.4)
RPR Ser Ql: NONREACTIVE
Rh Factor: POSITIVE
Rubella Antibodies, IGG: 16.7 index (ref >0.99)
WBC: 5.7 10*3/uL (ref 3.4–10.8)

## 2021-02-28 LAB — HEMOGLOBIN A1C
Est. average glucose Bld gHb Est-mCnc: 126 mg/dL
Hgb A1c MFr Bld: 6 % — ABNORMAL HIGH (ref 4.8–5.6)

## 2021-02-28 LAB — HCV INTERPRETATION

## 2021-03-01 LAB — AFP, SERUM, OPEN SPINA BIFIDA
AFP MoM: 1.33
AFP Value: 65.4 ng/mL
Gest. Age on Collection Date: 20 weeks
Maternal Age At EDD: 40.2 yr
OSBR Risk 1 IN: 4399
Test Results:: NEGATIVE
Weight: 203 [lb_av]

## 2021-03-02 LAB — MATERNIT21 PLUS CORE+SCA
Fetal Fraction: 10
Monosomy X (Turner Syndrome): NOT DETECTED
Result (T21): NEGATIVE
Trisomy 13 (Patau syndrome): NEGATIVE
Trisomy 18 (Edwards syndrome): NEGATIVE
Trisomy 21 (Down syndrome): NEGATIVE
XXX (Triple X Syndrome): NOT DETECTED
XXY (Klinefelter Syndrome): NOT DETECTED
XYY (Jacobs Syndrome): NOT DETECTED

## 2021-03-03 ENCOUNTER — Telehealth: Payer: Self-pay | Admitting: Genetics

## 2021-03-03 NOTE — Telephone Encounter (Signed)
Shelby Paul was contacted by telephone to review their noninvasive prenatal screening (NIPS) result. The result is low risk. This screening significantly reduces the risk that the current pregnancy has Down syndrome, Trisomy 85, Trisomy 17, and common sex chromosome aneuploidies. Shelby Paul understands that this is a screening and not a diagnostic test. All questions answered.

## 2021-03-05 ENCOUNTER — Telehealth: Payer: Self-pay | Admitting: *Deleted

## 2021-03-05 NOTE — Telephone Encounter (Signed)
TC to patient to notify of need to come in for early GTT due to elevated Hgb A1C, prediabetic. Patient scheduled to come in at 8:00 on 03/10/21 for GTT. Instructed NPO after MN. Advised of length of appt. Patient verbalized understanding.

## 2021-03-06 ENCOUNTER — Ambulatory Visit (HOSPITAL_COMMUNITY)
Admission: RE | Admit: 2021-03-06 | Discharge: 2021-03-06 | Disposition: A | Discharge status: Home / Self Care | Payer: Medicaid Other | Source: Ambulatory Visit | Attending: Obstetrics | Admitting: Obstetrics

## 2021-03-06 ENCOUNTER — Other Ambulatory Visit: Payer: Self-pay

## 2021-03-06 DIAGNOSIS — R1011 Right upper quadrant pain: Secondary | ICD-10-CM | POA: Insufficient documentation

## 2021-03-06 DIAGNOSIS — O26899 Other specified pregnancy related conditions, unspecified trimester: Secondary | ICD-10-CM | POA: Insufficient documentation

## 2021-03-10 ENCOUNTER — Other Ambulatory Visit: Payer: Medicaid Other

## 2021-03-21 ENCOUNTER — Other Ambulatory Visit: Payer: Self-pay

## 2021-03-21 ENCOUNTER — Other Ambulatory Visit: Payer: Self-pay | Admitting: *Deleted

## 2021-03-21 ENCOUNTER — Other Ambulatory Visit: Payer: Medicaid Other

## 2021-03-21 DIAGNOSIS — Z8632 Personal history of gestational diabetes: Secondary | ICD-10-CM

## 2021-03-22 LAB — GLUCOSE TOLERANCE, 2 HOURS W/ 1HR
Glucose, 1 hour: 151 mg/dL (ref 70–179)
Glucose, 2 hour: 129 mg/dL (ref 70–152)
Glucose, Fasting: 79 mg/dL (ref 70–91)

## 2021-03-26 ENCOUNTER — Other Ambulatory Visit: Payer: Self-pay | Admitting: *Deleted

## 2021-03-26 ENCOUNTER — Encounter: Payer: Self-pay | Admitting: *Deleted

## 2021-03-26 ENCOUNTER — Ambulatory Visit: Payer: Medicaid Other | Admitting: *Deleted

## 2021-03-26 ENCOUNTER — Ambulatory Visit: Payer: Medicaid Other | Attending: Obstetrics

## 2021-03-26 ENCOUNTER — Other Ambulatory Visit: Payer: Self-pay

## 2021-03-26 VITALS — BP 111/67 | HR 64

## 2021-03-26 DIAGNOSIS — Z8632 Personal history of gestational diabetes: Secondary | ICD-10-CM

## 2021-03-26 DIAGNOSIS — O09522 Supervision of elderly multigravida, second trimester: Secondary | ICD-10-CM

## 2021-03-26 DIAGNOSIS — O09299 Supervision of pregnancy with other poor reproductive or obstetric history, unspecified trimester: Secondary | ICD-10-CM | POA: Insufficient documentation

## 2021-03-26 DIAGNOSIS — Z6830 Body mass index (BMI) 30.0-30.9, adult: Secondary | ICD-10-CM | POA: Diagnosis present

## 2021-03-26 DIAGNOSIS — O0942 Supervision of pregnancy with grand multiparity, second trimester: Secondary | ICD-10-CM

## 2021-03-26 DIAGNOSIS — Z3A23 23 weeks gestation of pregnancy: Secondary | ICD-10-CM

## 2021-03-26 DIAGNOSIS — O09292 Supervision of pregnancy with other poor reproductive or obstetric history, second trimester: Secondary | ICD-10-CM | POA: Diagnosis not present

## 2021-03-26 DIAGNOSIS — O0932 Supervision of pregnancy with insufficient antenatal care, second trimester: Secondary | ICD-10-CM | POA: Insufficient documentation

## 2021-03-26 DIAGNOSIS — O34219 Maternal care for unspecified type scar from previous cesarean delivery: Secondary | ICD-10-CM | POA: Insufficient documentation

## 2021-03-27 ENCOUNTER — Ambulatory Visit (INDEPENDENT_AMBULATORY_CARE_PROVIDER_SITE_OTHER): Payer: Medicaid Other | Admitting: Obstetrics and Gynecology

## 2021-03-27 ENCOUNTER — Encounter: Payer: Self-pay | Admitting: Obstetrics and Gynecology

## 2021-03-27 VITALS — BP 107/74 | HR 84 | Wt 206.7 lb

## 2021-03-27 DIAGNOSIS — Z98891 History of uterine scar from previous surgery: Secondary | ICD-10-CM

## 2021-03-27 DIAGNOSIS — Z8632 Personal history of gestational diabetes: Secondary | ICD-10-CM

## 2021-03-27 DIAGNOSIS — Z3480 Encounter for supervision of other normal pregnancy, unspecified trimester: Secondary | ICD-10-CM

## 2021-03-27 NOTE — Progress Notes (Signed)
° °  PRENATAL VISIT NOTE  Subjective:  Shelby Paul is a 41 y.o. [id number] at [redacted]w[redacted]d being seen today for ongoing prenatal care.  She is currently monitored for the following issues for this high-risk pregnancy and has History of vitamin D deficiency; Cesarean delivery delivered; History of gestational diabetes; History of cesarean section; and Supervision of other normal pregnancy, antepartum on their problem list.  Patient reports no complaints.  Contractions: Not present. Vag. Bleeding: None.  Movement: Present. Denies leaking of fluid.   The following portions of the patient's history were reviewed and updated as appropriate: allergies, current medications, past family history, past medical history, past social history, past surgical history and problem list.   Objective:   Vitals:   03/27/21 0853  BP: 107/74  Pulse: 84  Weight: 206 lb 11.2 oz (93.8 kg)    Fetal Status: Fetal Heart Rate (bpm): 145 Fundal Height: 26 cm Movement: Present     General:  Alert, oriented and cooperative. Patient is in no acute distress.  Skin: Skin is warm and dry. No rash noted.   Cardiovascular: Normal heart rate noted  Respiratory: Normal respiratory effort, no problems with respiration noted  Abdomen: Soft, gravid, appropriate for gestational age.  Pain/Pressure: Present     Pelvic: Cervical exam deferred        Extremities: Normal range of motion.     Mental Status: Normal mood and affect. Normal behavior. Normal judgment and thought content.   Assessment and Plan:  Pregnancy: [id number] at [redacted]w[redacted]d 1. Supervision of other normal pregnancy, antepartum Patient is doing well without complaints Third trimester labs with glucola next visit  2. History of cesarean section Briefly discussed TOLAC vs RCS- information provided for review  3. History of gestational diabetes Normal early 2 hour  Preterm labor symptoms and general obstetric precautions including but not limited to vaginal bleeding,  contractions, leaking of fluid and fetal movement were reviewed in detail with the patient. Please refer to After Visit Summary for other counseling recommendations.   Return in about 4 weeks (around 04/24/2021) for in person, ROB, 2 hr glucola next visit.  Future Appointments  Date Time Provider [place]  04/21/2021  7:30 AM [facility] NURSE [facility]  04/21/2021  7:45 AM [facility] US5 [facility] [place]    Mora Bellman, MD

## 2021-03-27 NOTE — Progress Notes (Signed)
Patient presents for ROB. Has no concerns at this time.

## 2021-04-16 IMAGING — US US MFM OB FOLLOW-UP
1 series · 14 of 28 positions shown · non-contrast
Comparison: none

[Series 1: us mfm ob follow-up · 14 of 29 slices shown]
[im 2/29]
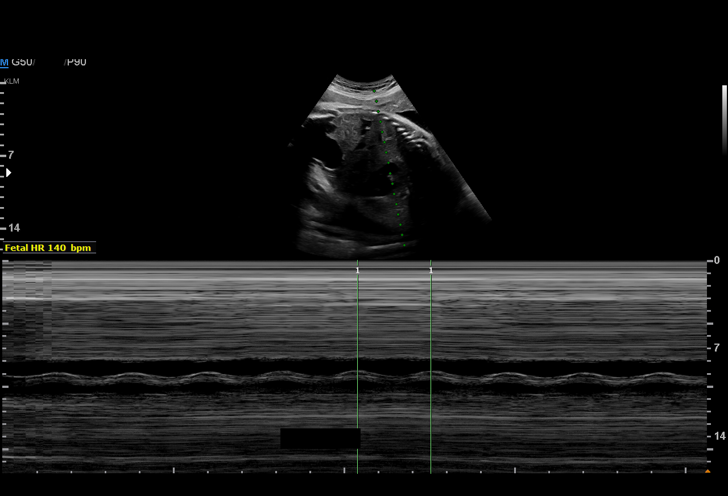
[im 4/29]
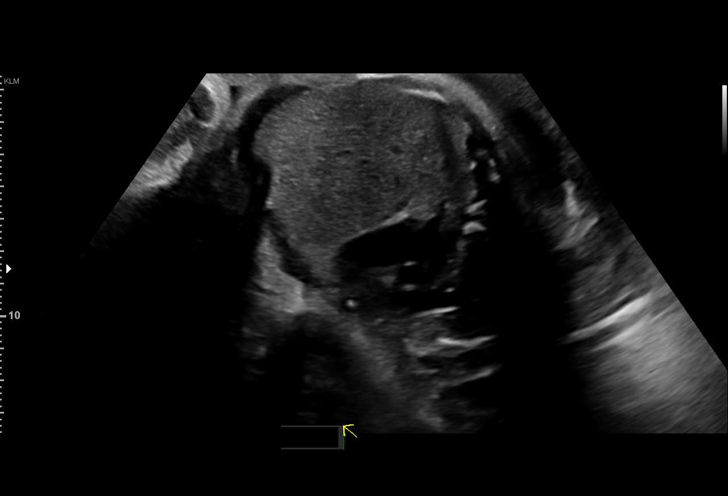
[im 6/29]
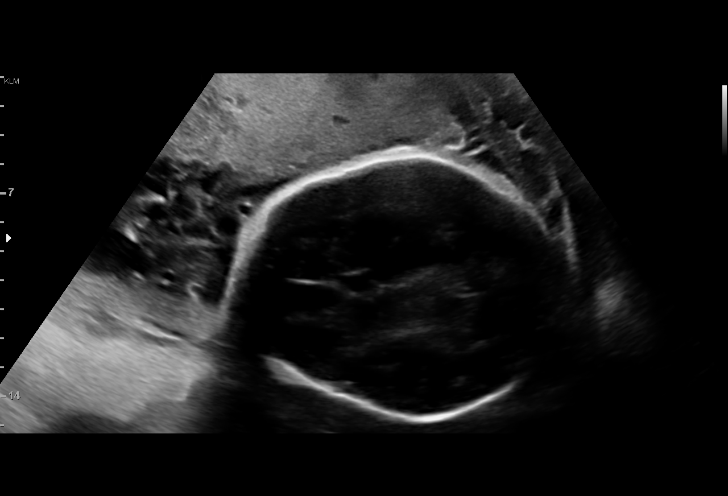
[im 8/29]
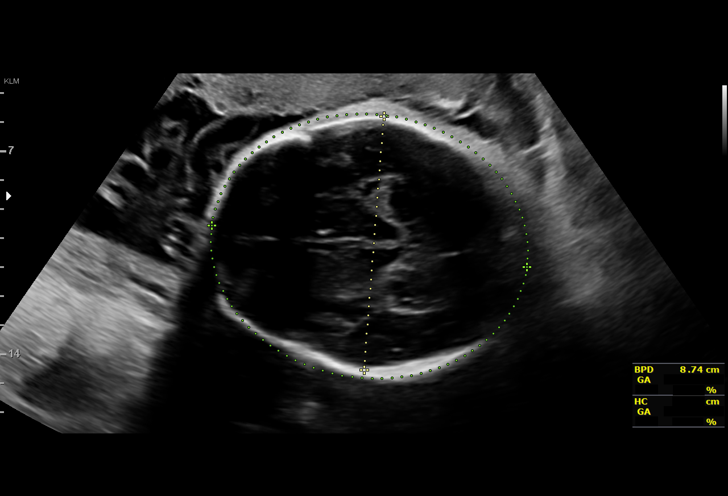
[im 10/29]
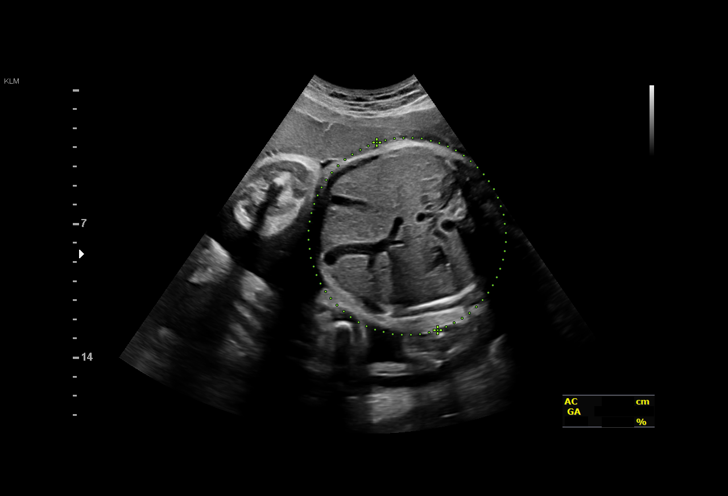
[im 12/29]
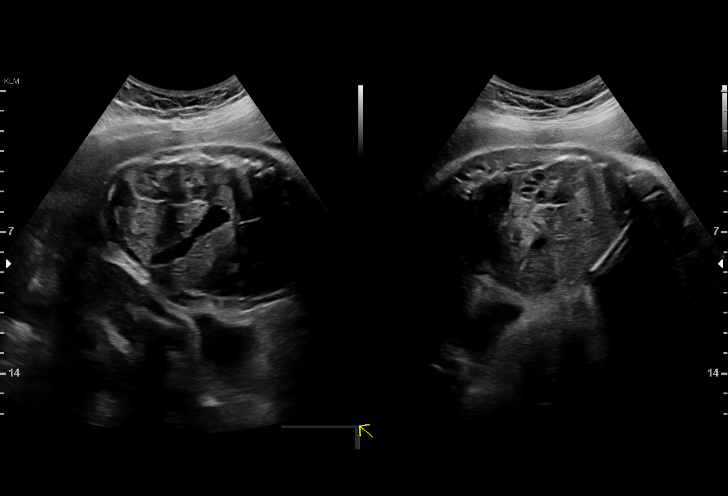
[im 14/29]
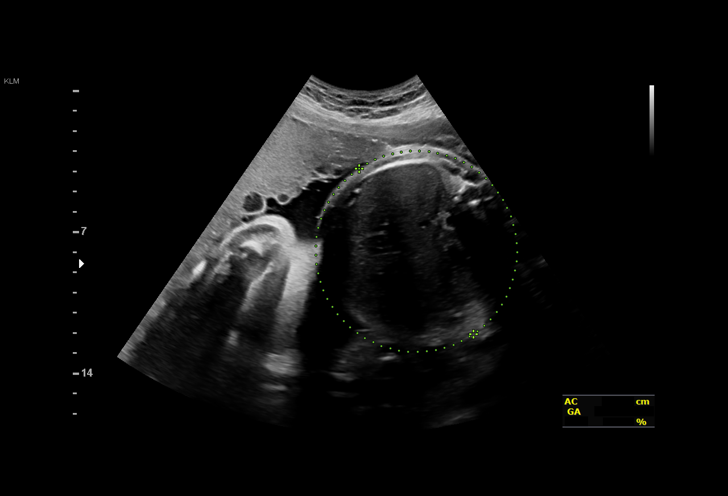
[im 16/29]
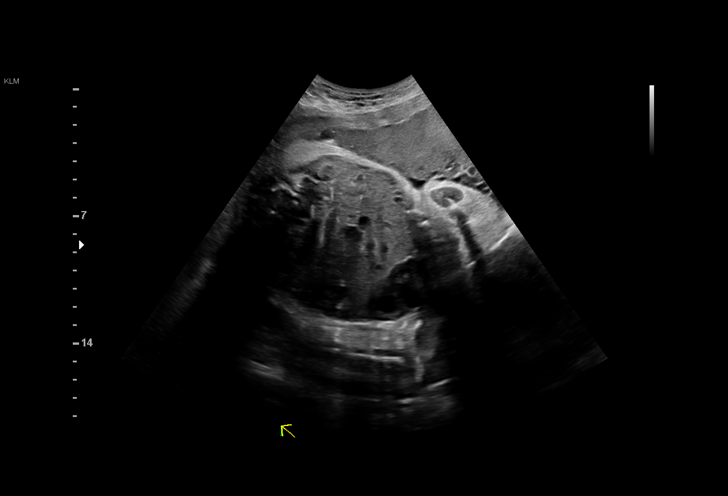
[im 18/29]
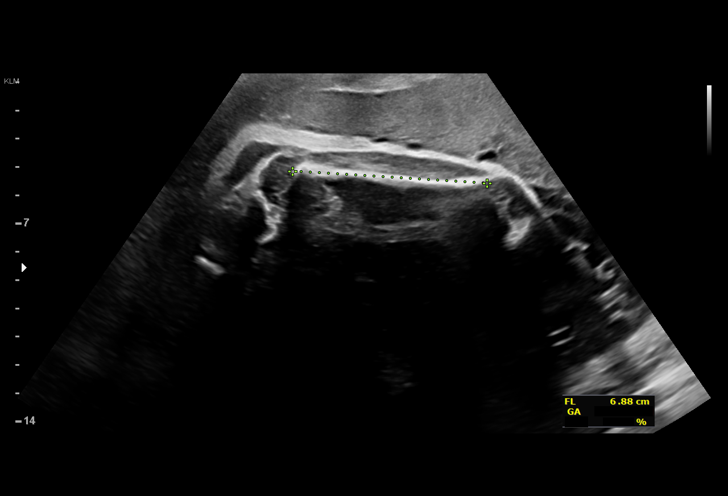
[im 20/29]
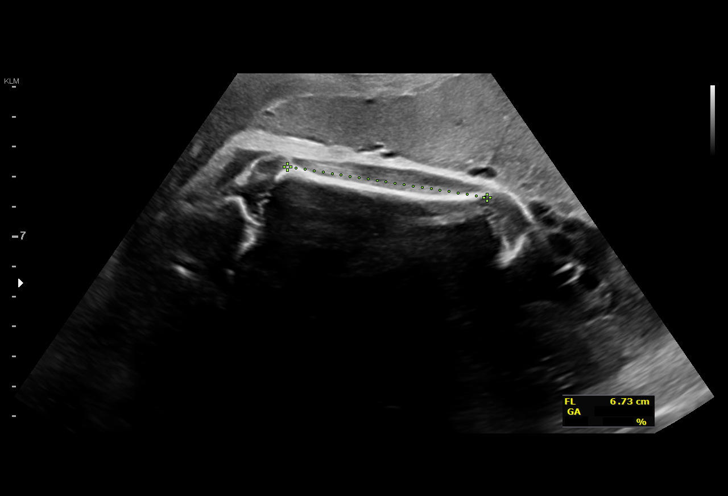
[im 22/29]
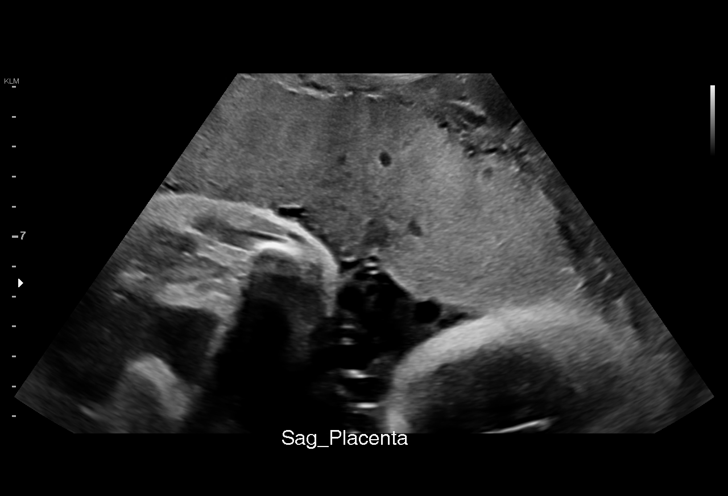
[im 24/29]
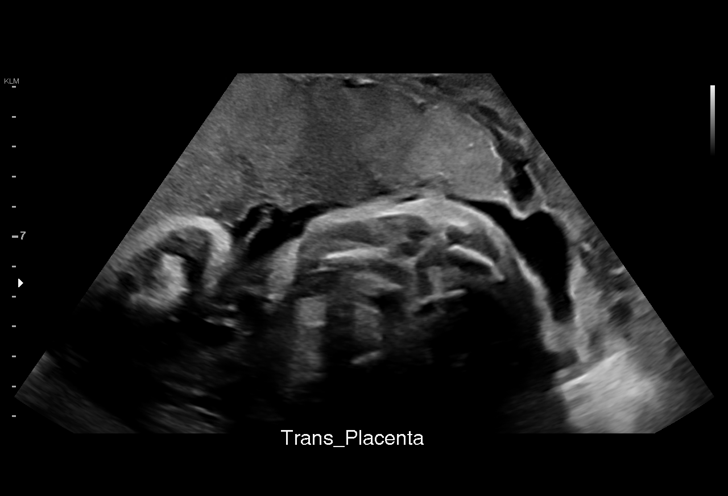
[im 26/29]
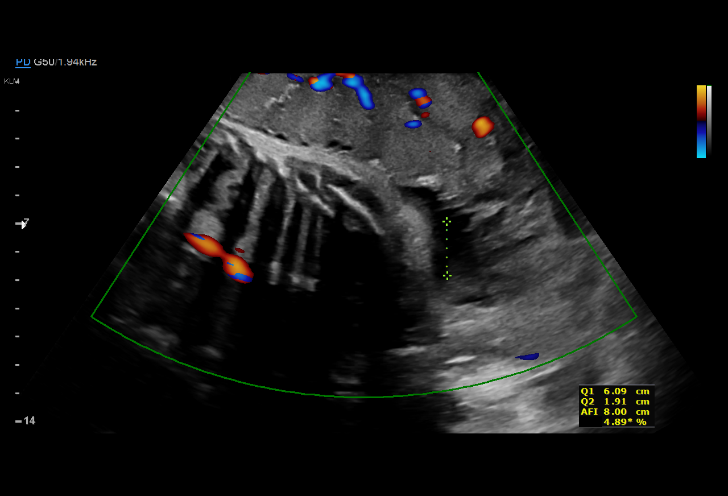
[im 29/29]
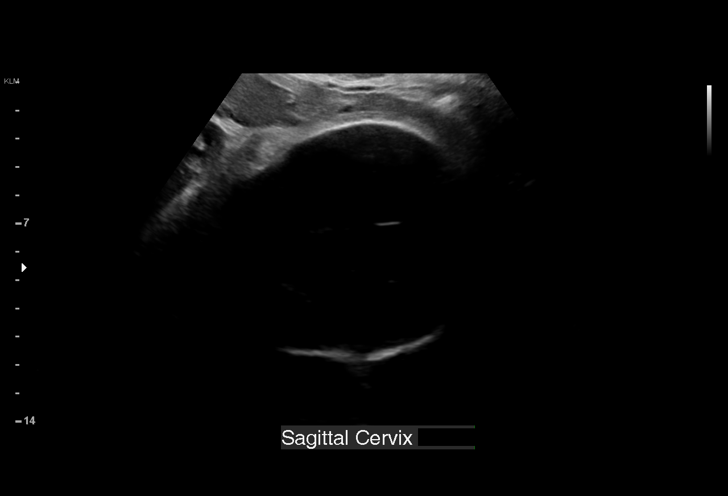

[14 of 28 positions shown; findings below may reference images not displayed]

Indications

 Gestational diabetes in pregnancy, diet
 controlled
 34 weeks gestation of pregnancy
 Late to prenatal care, third trimester
 Advanced maternal age multigravida 35+,
 third trimester
Vital Signs

                                                Height:        5'6"
Fetal Evaluation

 Num Of Fetuses:         1
 Fetal Heart Rate(bpm):  140
 Cardiac Activity:       Observed
 Presentation:           Cephalic
 Placenta:               Anterior
 P. Cord Insertion:      Previously Visualized

 Amniotic Fluid
 AFI FV:      Within normal limits

 AFI Sum(cm)     %Tile       Largest Pocket(cm)
 13.75           47

 RUQ(cm)       RLQ(cm)       LUQ(cm)        LLQ(cm)

Biometry

 BPD:      87.7  mm     G. Age:  35w 3d         80  %    CI:        74.18   %    70 - 86
                                                         FL/HC:      21.0   %    19.4 -
 HC:      323.3  mm     G. Age:  36w 4d         72  %    HC/AC:      1.02        0.96 -
 AC:      317.8  mm     G. Age:  35w 5d         89  %    FL/BPD:     77.5   %    71 - 87
 FL:         68  mm     G. Age:  35w 0d         59  %    FL/AC:      21.4   %    20 - 24

 Est. FW:    9557  gm           6 lb     81  %
OB History

 Gravidity:    6         Term:   4        Prem:   0        SAB:   1
 TOP:          0       Ectopic:  0        Living: 4
Gestational Age

 LMP:           34w 2d        Date:  02/26/19                 EDD:   12/03/19
 U/S Today:     35w 5d                                        EDD:   11/23/19
 Best:          34w 2d     Det. By:  LMP  (02/26/19)          EDD:   12/03/19
Anatomy

 Cranium:               Appears normal         LVOT:                   Appears normal
 Cavum:                 Appears normal         Aortic Arch:            Previously seen
 Ventricles:            Appears normal         Ductal Arch:            Previously seen
 Choroid Plexus:        Previously seen        Diaphragm:              Appears normal
 Cerebellum:            Previously seen        Stomach:                Appears normal, left
                                                                       sided
 Posterior Fossa:       Previously seen        Abdomen:                Appears normal
 Nuchal Fold:           Not applicable (>20    Abdominal Wall:         Previously seen
                        wks GA)
 Face:                  Orbits and profile     Cord Vessels:           Previously seen
                        previously seen
 Lips:                  Previously seen        Kidneys:                Appear normal
 Palate:                Previously seen        Bladder:                Appears normal
 Thoracic:              Appears normal         Spine:                  Previously seen
 Heart:                 Previously seen        Upper Extremities:      Previously seen
 RVOT:                  Appears normal         Lower Extremities:      Previously seen
Cervix Uterus Adnexa

 Cervix
 Not visualized (advanced GA >70wks)
Impression

 Follow up growth due to maternal L2DEF
 Normal interval growth with measurements consistent with
 dates.
 Good fetal movement and amniotic fluid
Recommendations

 Continue serial growth in 4 weeks.

## 2021-04-21 ENCOUNTER — Ambulatory Visit: Payer: Medicaid Other | Attending: Obstetrics and Gynecology

## 2021-04-21 ENCOUNTER — Other Ambulatory Visit: Payer: Self-pay

## 2021-04-21 ENCOUNTER — Other Ambulatory Visit: Payer: Self-pay | Admitting: *Deleted

## 2021-04-21 ENCOUNTER — Ambulatory Visit: Payer: Medicaid Other | Admitting: *Deleted

## 2021-04-21 VITALS — BP 108/69 | HR 78

## 2021-04-21 DIAGNOSIS — O09522 Supervision of elderly multigravida, second trimester: Secondary | ICD-10-CM

## 2021-04-21 DIAGNOSIS — Z3480 Encounter for supervision of other normal pregnancy, unspecified trimester: Secondary | ICD-10-CM

## 2021-04-21 DIAGNOSIS — O0932 Supervision of pregnancy with insufficient antenatal care, second trimester: Secondary | ICD-10-CM | POA: Diagnosis not present

## 2021-04-21 DIAGNOSIS — O09292 Supervision of pregnancy with other poor reproductive or obstetric history, second trimester: Secondary | ICD-10-CM | POA: Insufficient documentation

## 2021-04-21 DIAGNOSIS — O34211 Maternal care for low transverse scar from previous cesarean delivery: Secondary | ICD-10-CM | POA: Insufficient documentation

## 2021-04-21 DIAGNOSIS — O09299 Supervision of pregnancy with other poor reproductive or obstetric history, unspecified trimester: Secondary | ICD-10-CM

## 2021-04-21 DIAGNOSIS — O99212 Obesity complicating pregnancy, second trimester: Secondary | ICD-10-CM | POA: Diagnosis not present

## 2021-04-21 DIAGNOSIS — Z3A27 27 weeks gestation of pregnancy: Secondary | ICD-10-CM | POA: Insufficient documentation

## 2021-04-21 DIAGNOSIS — O0942 Supervision of pregnancy with grand multiparity, second trimester: Secondary | ICD-10-CM

## 2021-04-21 DIAGNOSIS — Z8632 Personal history of gestational diabetes: Secondary | ICD-10-CM

## 2021-04-24 ENCOUNTER — Ambulatory Visit (INDEPENDENT_AMBULATORY_CARE_PROVIDER_SITE_OTHER): Payer: Medicaid Other | Admitting: Family Medicine

## 2021-04-24 ENCOUNTER — Other Ambulatory Visit: Payer: Self-pay

## 2021-04-24 ENCOUNTER — Other Ambulatory Visit: Payer: Medicaid Other

## 2021-04-24 ENCOUNTER — Encounter: Payer: Self-pay | Admitting: Family Medicine

## 2021-04-24 VITALS — BP 118/76 | HR 80 | Wt 209.4 lb

## 2021-04-24 DIAGNOSIS — Z3A28 28 weeks gestation of pregnancy: Secondary | ICD-10-CM | POA: Diagnosis not present

## 2021-04-24 DIAGNOSIS — Z8639 Personal history of other endocrine, nutritional and metabolic disease: Secondary | ICD-10-CM

## 2021-04-24 DIAGNOSIS — Z3480 Encounter for supervision of other normal pregnancy, unspecified trimester: Secondary | ICD-10-CM | POA: Diagnosis not present

## 2021-04-24 DIAGNOSIS — Z98891 History of uterine scar from previous surgery: Secondary | ICD-10-CM

## 2021-04-24 DIAGNOSIS — Z23 Encounter for immunization: Secondary | ICD-10-CM | POA: Diagnosis not present

## 2021-04-24 DIAGNOSIS — Z8632 Personal history of gestational diabetes: Secondary | ICD-10-CM

## 2021-04-24 MED ORDER — PRENATAL PLUS VITAMIN/MINERAL 27-1 MG PO TABS: 1.0000 | ORAL_TABLET | Freq: Every day | ORAL | 5 refills | Status: DC

## 2021-04-24 NOTE — Progress Notes (Signed)
Pt presents for ROB and 2 gtt labs. ?She requests to change PNV from gelatin.  ?Pt also requests to have her Vit D levels checked - hx of Vit D deficiency  ?PHQ9= 1 ?GAD7=2  ?Tdap given R Del without difficulty.  ?

## 2021-04-24 NOTE — Progress Notes (Signed)
? ?PRENATAL VISIT NOTE ? ?Subjective:  ?Shelby Paul is a 41 y.o. [id number] at [redacted]w[redacted]d being seen today for ongoing prenatal care.  She is currently monitored for the following issues for this high-risk pregnancy and has History of vitamin D deficiency; Cesarean delivery delivered; History of gestational diabetes; History of cesarean section; and Supervision of other normal pregnancy, antepartum on their problem list. ? ?Patient reports intermittent pelvic pain. Mostly notices it after she is up and moving around. Sometimes sharp in nature, goes away on it's own. Not severe, just bothersome. Has been going on for most of her pregnancy. No abdominal discharge or dysuria. Wondering what she can do to help with this. Also reports history of vitamin D deficiency, would like this checked today.  Contractions: Not present. Vag. Bleeding: None.  Movement: Present. Denies leaking of fluid. ? ?The following portions of the patient's history were reviewed and updated as appropriate: allergies, current medications, past family history, past medical history, past social history, past surgical history and problem list.  ? ?Objective:  ? ?Vitals:  ? 04/24/21 0826  ?BP: 118/76  ?Pulse: 80  ?Weight: 209 lb 6.4 oz (95 kg)  ? ? ?Fetal Status: Fetal Heart Rate (bpm): 130   Movement: Present   ? ?General:  Alert, oriented and cooperative. Patient is in no acute distress.  ?Skin: Skin is warm and dry. No rash noted.   ?Cardiovascular: Normal heart rate noted.  ?Respiratory: Normal respiratory effort, no problems with respiration noted.  ?Abdomen: Soft, gravid, nontender to palpation, appropriate for gestational age.      ?Pelvic: Cervical exam deferred.   ?Extremities: Normal range of motion.  Edema: None.  ?Mental Status: Normal mood and affect. Normal behavior. Normal judgment and thought content.  ? ?Assessment and Plan:  ?Pregnancy: [id number] at [redacted]w[redacted]d ? ?1. Supervision of other normal pregnancy, antepartum ?2. [redacted] weeks gestation of  pregnancy ?Progressing well. FHT within normal limits. Discussed ways to optimize pelvic discomfort in progressing pregnancy. Recommended trial of abdominal binder. Patient will try this and reassess at next visit. 28 week labs obtained today and Tdap given. Follow up in 2 weeks for next OB visit.  ?- Glucose Tolerance, 2 Hours w/1 Hour ?- RPR ?- CBC ?- HIV antibody (with reflex) ?- Tdap vaccine greater than or equal to 7yo IM ? ?3. History of gestational diabetes ?Early 2 hr GTT normal. Repeat obtained today. Will follow up results.  ? ?4. History of cesarean section ?Discussed RCS vs TOLAC with patient. She is still undecided about how she would like to proceed. Planning for BTL for contraception. Counseled today. Would like to think about this further before making a decision. Will discuss again at next visit.  ? ?5. History of vitamin D deficiency ?Reports needing to take supplemental vitamin D in the past due to levels being low. Will check today. Reports she is not taking her prenatal vitamins because they were gummies/gelatin. Requesting pill form. Will send to pharmacy today and follow up results.  ?- Vitamin D 1,25 dihydroxy ? ?Preterm labor symptoms and general obstetric precautions including but not limited to vaginal bleeding, contractions, leaking of fluid and fetal movement were reviewed in detail with the patient. ? ?Please refer to After Visit Summary for other counseling recommendations.  ? ?Return in about 2 weeks (around 05/08/2021) for follow up HR OB visit. ? ?Future Appointments  ?Date Time Provider Department Center  ?05/26/2021  7:15 AM [facility]  ?05/26/2021  7:30 AM [facility] US2  [facility]  ? ? ?Worthy Rancher, MD ? ?

## 2021-05-05 NOTE — Progress Notes (Deleted)
? ?  PRENATAL VISIT NOTE ? ?Subjective:  ?Shelby Paul is a 41 y.o. [id number] at 30w***d being seen today for ongoing prenatal care.  She is currently monitored for the following issues for this high-risk pregnancy and has History of vitamin D deficiency; Cesarean delivery delivered; History of gestational diabetes; History of cesarean section; and Supervision of other normal pregnancy, antepartum on their problem list. ? ?Patient reports {[id number]}.   .  .   . Denies leaking of fluid.  ? ?The following portions of the patient's history were reviewed and updated as appropriate: allergies, current medications, past family history, past medical history, past social history, past surgical history and problem list.  ? ?Objective:  ?There were no vitals filed for this visit. ? ?Fetal Status:          ? ?General:  Alert, oriented and cooperative. Patient is in no acute distress.  ?Skin: Skin is warm and dry. No rash noted.   ?Cardiovascular: Normal heart rate noted  ?Respiratory: Normal respiratory effort, no problems with respiration noted  ?Abdomen: Soft, gravid, appropriate for gestational age.        ?Pelvic: Cervical exam deferred        ?Extremities: Normal range of motion.     ?Mental Status: Normal mood and affect. Normal behavior. Normal judgment and thought content.  ? ?Assessment and Plan:  ?Pregnancy: [id number] at 30w***d ?1. Cesarean delivery delivered ?- Discussed MOD ? ?2. History of gestational diabetes ?- 28w labs done last time and *** ? ?3. History of cesarean section ? ?4. Supervision of other normal pregnancy, antepartum ?- Routine pnc up to date ?- AMA@40 , next growth is 4/17. Last growth was 91%ile, AC 0000000, cephalic ? ?5. History of vitamin D deficiency ? ? ?Preterm labor symptoms and general obstetric precautions including but not limited to vaginal bleeding, contractions, leaking of fluid and fetal movement were reviewed in detail with the patient. ?Please refer to After Visit Summary for other  counseling recommendations.  ? ?No follow-ups on file. ? ?Future Appointments  ?Date Time Provider [place]  ?05/09/2021  8:15 AM Radene Gunning, MD [place] None  ?05/26/2021  7:15 AM [facility]  ?05/26/2021  7:30 AM [facility]2 [facility]  ? ? ?Radene Gunning, MD ?

## 2021-05-06 ENCOUNTER — Other Ambulatory Visit: Payer: Self-pay | Admitting: Family Medicine

## 2021-05-06 DIAGNOSIS — O24419 Gestational diabetes mellitus in pregnancy, unspecified control: Secondary | ICD-10-CM

## 2021-05-06 LAB — CBC
Hematocrit: 36.6 % (ref 34.0–46.6)
Hemoglobin: 12 g/dL (ref 11.1–15.9)
MCH: 27.8 pg (ref 26.6–33.0)
MCHC: 32.8 g/dL (ref 31.5–35.7)
MCV: 85 fL (ref 79–97)
Platelets: 150 10*3/uL (ref 150–450)
RBC: 4.31 x10E6/uL (ref 3.77–5.28)
RDW: 12.7 % (ref 11.7–15.4)
WBC: 5.9 10*3/uL (ref 3.4–10.8)

## 2021-05-06 LAB — GLUCOSE TOLERANCE, 2 HOURS W/ 1HR
Glucose, 1 hour: 161 mg/dL (ref 70–179)
Glucose, 2 hour: 168 mg/dL — ABNORMAL HIGH (ref 70–152)
Glucose, Fasting: 83 mg/dL (ref 70–91)

## 2021-05-06 LAB — VITAMIN D 1,25 DIHYDROXY
Vitamin D 1, 25 (OH)2 Total: 170 pg/mL — ABNORMAL HIGH
Vitamin D2 1, 25 (OH)2: <10 pg/mL
Vitamin D3 1, 25 (OH)2: 167 pg/mL

## 2021-05-06 LAB — RPR: RPR Ser Ql: NONREACTIVE

## 2021-05-06 LAB — HIV ANTIBODY (ROUTINE TESTING W REFLEX): HIV Screen 4th Generation wRfx: NONREACTIVE

## 2021-05-06 MED ORDER — BLOOD GLUCOSE MONITOR KIT: PACK | 0 refills | Status: DC

## 2021-05-06 NOTE — Progress Notes (Signed)
TC to notify. 1st call, answer, hang up. 2nd call went to VM, VM full. No MyChart access.

## 2021-05-07 ENCOUNTER — Telehealth: Payer: Self-pay | Admitting: Emergency Medicine

## 2021-05-07 NOTE — Telephone Encounter (Signed)
TC to patient to discuss failed 2hr gtt and discuss GDM diagnosis.  ? ?Pt made aware that glucometer and supplies have been sent to pharmacy.  ? ?

## 2021-05-09 ENCOUNTER — Encounter: Payer: Medicaid Other | Admitting: Obstetrics and Gynecology

## 2021-05-09 DIAGNOSIS — Z8639 Personal history of other endocrine, nutritional and metabolic disease: Secondary | ICD-10-CM

## 2021-05-09 DIAGNOSIS — Z98891 History of uterine scar from previous surgery: Secondary | ICD-10-CM

## 2021-05-09 DIAGNOSIS — Z3480 Encounter for supervision of other normal pregnancy, unspecified trimester: Secondary | ICD-10-CM

## 2021-05-09 DIAGNOSIS — Z8632 Personal history of gestational diabetes: Secondary | ICD-10-CM

## 2021-05-25 IMAGING — DX DG ABDOMEN 1V
1 series · 1 of 1 positions shown · non-contrast
Comparison: None.

CLINICAL DATA: Instrument count.

EXAM:
ABDOMEN - 1 VIEW

[abdomen kub]
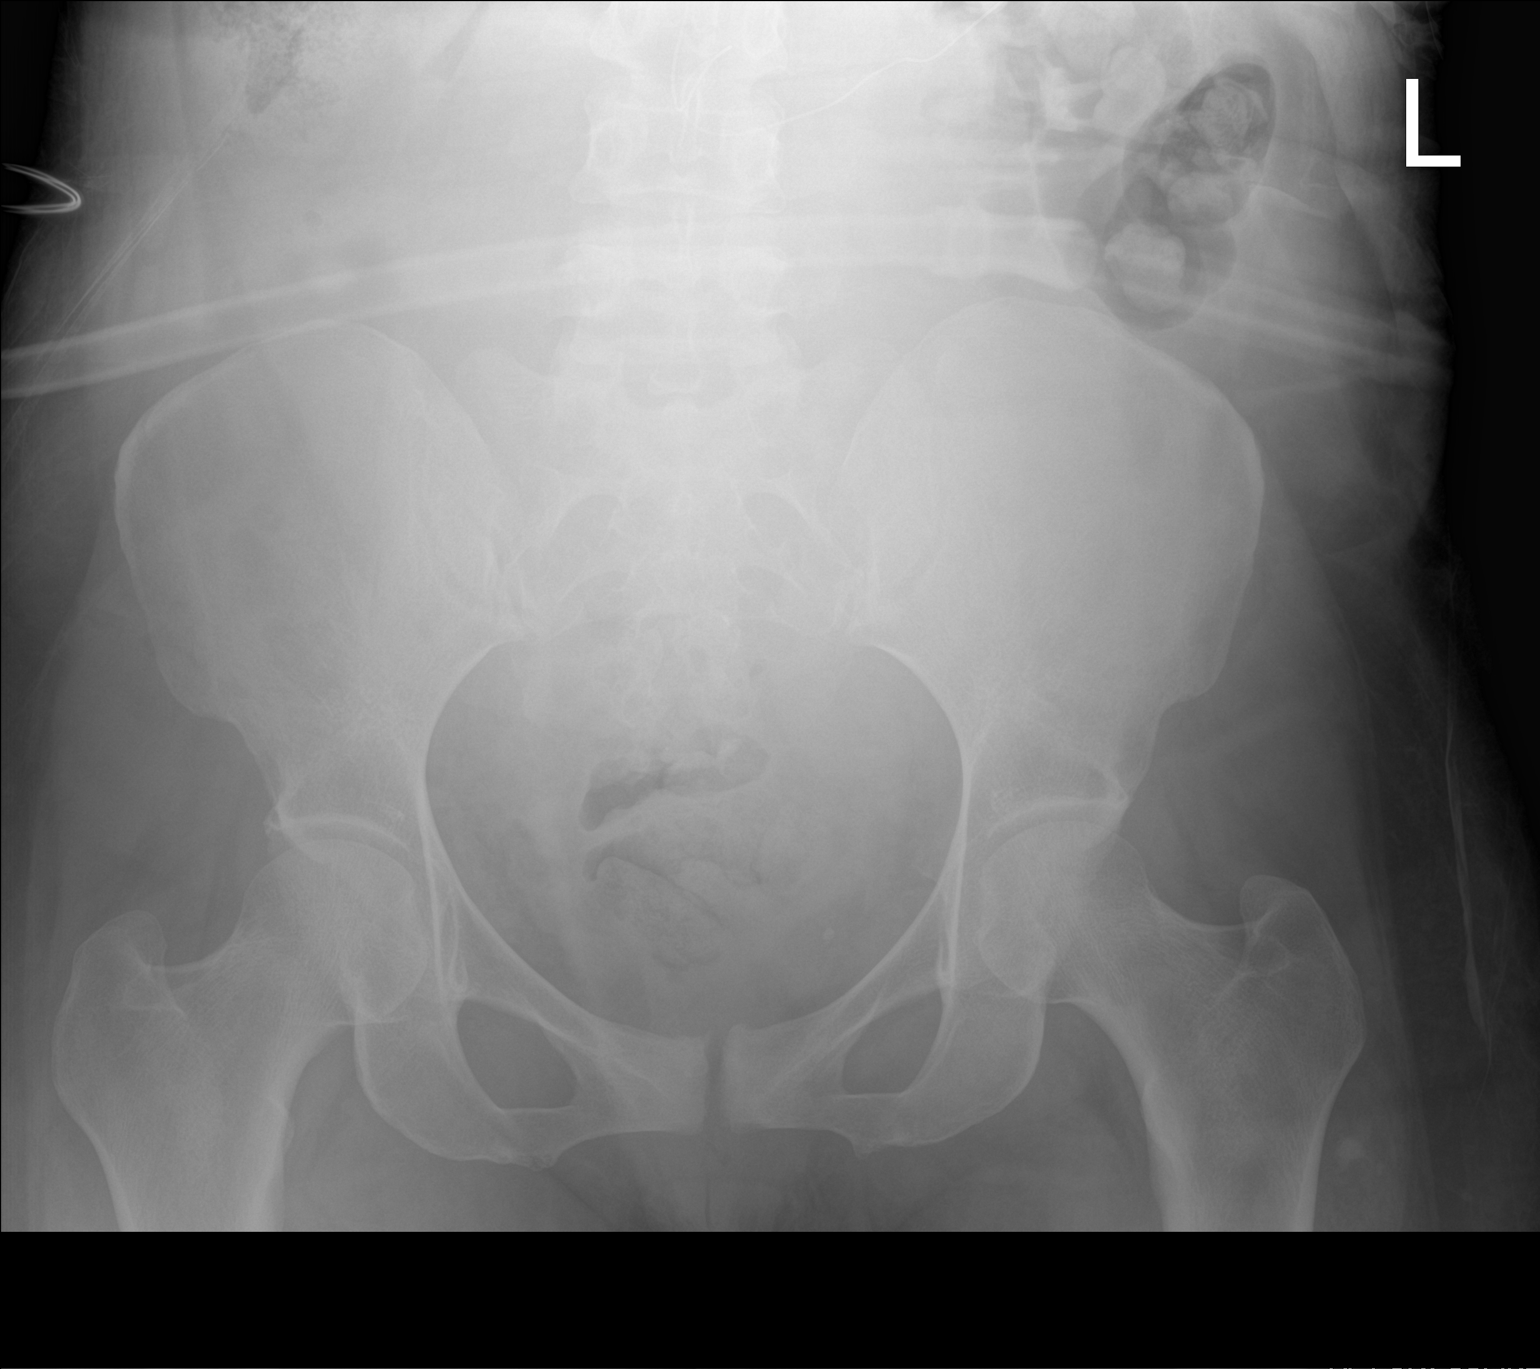

[1 of 1 positions shown; findings below may reference images not displayed]

FINDINGS: The bowel gas pattern is normal. No radio-opaque calculi or other
significant radiographic abnormality are seen. Probable distal
portion of nasogastric tube is seen in upper abdomen. No other
radiopaque foreign body is noted.
IMPRESSION: Probable distal portion of nasogastric tube is seen in upper
abdomen. No other radiopaque foreign body is noted.

## 2021-05-26 ENCOUNTER — Ambulatory Visit: Payer: Medicaid Other | Attending: Obstetrics and Gynecology

## 2021-05-26 ENCOUNTER — Ambulatory Visit: Payer: Medicaid Other

## 2021-06-05 ENCOUNTER — Ambulatory Visit (INDEPENDENT_AMBULATORY_CARE_PROVIDER_SITE_OTHER): Payer: Medicaid Other

## 2021-06-05 VITALS — BP 132/79 | HR 86 | Wt 204.0 lb

## 2021-06-05 DIAGNOSIS — Z3480 Encounter for supervision of other normal pregnancy, unspecified trimester: Secondary | ICD-10-CM

## 2021-06-05 DIAGNOSIS — Z3A34 34 weeks gestation of pregnancy: Secondary | ICD-10-CM

## 2021-06-05 DIAGNOSIS — Z98891 History of uterine scar from previous surgery: Secondary | ICD-10-CM

## 2021-06-05 NOTE — Progress Notes (Addendum)
? ?  HIGH-RISK PREGNANCY OFFICE VISIT ? ?Patient name: Shelby Paul MRN [id number]  Date of birth: Jun 19, 1980 ?Chief Complaint:   ?Routine Prenatal Visit ? ?Subjective:   ?Shelby Paul is a 41 y.o. [id number] female at [redacted]w[redacted]d with an Estimated Date of Delivery: 07/17/21 being seen today for ongoing management of a High-risk pregnancy aeb has History of vitamin D deficiency; Cesarean delivery delivered; History of gestational diabetes; History of cesarean section; and Supervision of other normal pregnancy, antepartum on their problem list. ? ?Patient presents today, alone, with no complaints.  Patient endorses fetal movement. Patient denies abdominal cramping or contractions.  Patient denies vaginal concerns including abnormal discharge, leaking of fluid, and bleeding.  ? ?She reports having intermittent pain in her pelvic area that improves with fasting. She reports that it also improved with ambulation!  ? ? Contractions: Irritability. Vag. Bleeding: None.  Movement: Present. ? ?Reviewed past medical,surgical, social, obstetrical and family history as well as problem list, medications and allergies. ? ?Objective  ? ?Vitals:  ? 06/05/21 0938  ?BP: 132/79  ?Pulse: 86  ?Weight: 204 lb (92.5 kg)  ?Body mass index is 32.93 kg/m?.  ?Total Weight Gain:14 lb (6.35 kg) ? ?  ?     Physical Examination:  ? General appearance: Well appearing, and in no distress ? Mental status: Alert, oriented to person, place, and time ? Skin: Warm & dry ? Cardiovascular: Normal heart rate noted ? Respiratory: Normal respiratory effort, no distress ? Abdomen: Soft, gravid, nontender, AGA with Fundal Height: 36 cm ? Pelvic: Cervical exam deferred          ? Extremities: Edema: None ? ?Fetal Status: Fetal Heart Rate (bpm): 140  Movement: Present  ? ?No results found for this or any previous visit (from the past 24 hour(s)).  ?Assessment & Plan:  ?High-risk pregnancy of a 41 y.o., [id number] at [redacted]w[redacted]d with an Estimated Date of Delivery: 07/17/21  ? ?1.  Supervision of other normal pregnancy, antepartum ?-Anticipatory guidance for upcoming appts. ?-Patient to schedule next appt in 2 weeks for an in-person visit. ?-Educated on GBS bacteria including what it is, why we test, and how and when we treat if needed. ?-Plan to collect with standard STD testing at next visit.  ? ?2. [redacted] weeks gestation of pregnancy ?-Doing well. ?-Reviewed c/o pelvic pain. Informed that provider unsure what could be causing pain that is resolved with movement! ?-Discussed possibly needing to stretch. ? ? ?3. History of cesarean section ?-Unsure of mode of delivery for this pregnancy. ?-Will schedule with MD for discussion of TOLAC vs C/S. ?-Of note, patient previous delivery summary documents vertical incision.  ?-C/S report reviewed with Dr. Rosendo Gros and concludes likely mistake as report documents transverse incision. ? ?  ?Meds: No orders of the defined types were placed in this encounter. ? ?Labs/procedures today:  ?Lab Orders  ?No laboratory test(s) ordered today  ?  ? ?Reviewed: Preterm labor symptoms and general obstetric precautions including but not limited to vaginal bleeding, contractions, leaking of fluid and fetal movement were reviewed in detail with the patient.  All questions were answered. ? ?Follow-up: Return in about 2 weeks (around 06/19/2021) for HROB with GBS. ? ?No orders of the defined types were placed in this encounter. ? ?Maryann Conners MSN, CNM ?06/05/2021  ?

## 2021-06-23 ENCOUNTER — Encounter: Payer: Self-pay | Admitting: Obstetrics and Gynecology

## 2021-06-23 ENCOUNTER — Ambulatory Visit (INDEPENDENT_AMBULATORY_CARE_PROVIDER_SITE_OTHER): Payer: Medicaid Other | Admitting: Obstetrics and Gynecology

## 2021-06-23 ENCOUNTER — Other Ambulatory Visit (HOSPITAL_COMMUNITY)
Admission: RE | Admit: 2021-06-23 | Discharge: 2021-06-23 | Disposition: A | Discharge status: Home / Self Care | Payer: Medicaid Other | Source: Ambulatory Visit | Attending: Obstetrics and Gynecology | Admitting: Obstetrics and Gynecology

## 2021-06-23 VITALS — BP 110/73 | HR 67 | Wt 207.0 lb

## 2021-06-23 DIAGNOSIS — Z8632 Personal history of gestational diabetes: Secondary | ICD-10-CM

## 2021-06-23 DIAGNOSIS — O34211 Maternal care for low transverse scar from previous cesarean delivery: Secondary | ICD-10-CM

## 2021-06-23 DIAGNOSIS — Z3A36 36 weeks gestation of pregnancy: Secondary | ICD-10-CM

## 2021-06-23 DIAGNOSIS — Z3480 Encounter for supervision of other normal pregnancy, unspecified trimester: Secondary | ICD-10-CM

## 2021-06-23 DIAGNOSIS — Z98891 History of uterine scar from previous surgery: Secondary | ICD-10-CM

## 2021-06-23 NOTE — Progress Notes (Signed)
? ?  PRENATAL VISIT NOTE ? ?Subjective:  ?Shelby Paul is a 41 y.o. [id number] at [redacted]w[redacted]d being seen today for ongoing prenatal care.  She is currently monitored for the following issues for this high-risk pregnancy and has History of vitamin D deficiency; Cesarean delivery delivered; History of gestational diabetes; History of cesarean section; and Supervision of other normal pregnancy, antepartum on their problem list. ? ?Patient reports no complaints.   . Vag. Bleeding: None.  Movement: Present. Denies leaking of fluid.  ? ?The following portions of the patient's history were reviewed and updated as appropriate: allergies, current medications, past family history, past medical history, past social history, past surgical history and problem list.  ? ?Objective:  ? ?Vitals:  ? 06/23/21 0854  ?BP: 110/73  ?Pulse: 67  ?Weight: 207 lb (93.9 kg)  ? ? ?Fetal Status: Fetal Heart Rate (bpm): 132 Fundal Height: 37 cm Movement: Present    ? ?General:  Alert, oriented and cooperative. Patient is in no acute distress.  ?Skin: Skin is warm and dry. No rash noted.   ?Cardiovascular: Normal heart rate noted  ?Respiratory: Normal respiratory effort, no problems with respiration noted  ?Abdomen: Soft, gravid, appropriate for gestational age.  Pain/Pressure: Present     ?Pelvic: Cervical exam performed in the presence of a chaperone Dilation: 1 Effacement (%): Thick Station: Ballotable  ?Extremities: Normal range of motion.  Edema: None  ?Mental Status: Normal mood and affect. Normal behavior. Normal judgment and thought content.  ? ?Assessment and Plan:  ?Pregnancy: [id number] at [redacted]w[redacted]d ?1. Supervision of other normal pregnancy, antepartum ?Patient is doing well without complaints ?Cultures today ? ?2. History of cesarean section ?Patient opted for repeat c-section after counseling ? ?3. History of gestational diabetes ? ? ?Preterm labor symptoms and general obstetric precautions including but not limited to vaginal bleeding,  contractions, leaking of fluid and fetal movement were reviewed in detail with the patient. ?Please refer to After Visit Summary for other counseling recommendations.  ? ?Return in about 1 week (around 06/30/2021) for in person, ROB, High risk. ? ?No future appointments. ? ?Mora Bellman, MD ? ?

## 2021-06-23 NOTE — Progress Notes (Signed)
ROB/GBS.  Reports no problems today.  Pt is requesting a letter for her Mother to come to the [place]. ?

## 2021-06-24 LAB — CERVICOVAGINAL ANCILLARY ONLY
Chlamydia: NEGATIVE
Comment: NEGATIVE
Comment: NORMAL
Neisseria Gonorrhea: NEGATIVE

## 2021-06-27 LAB — CULTURE, BETA STREP (GROUP B ONLY): Strep Gp B Culture: NEGATIVE

## 2021-06-30 ENCOUNTER — Encounter: Payer: Self-pay | Admitting: Obstetrics and Gynecology

## 2021-06-30 ENCOUNTER — Ambulatory Visit (INDEPENDENT_AMBULATORY_CARE_PROVIDER_SITE_OTHER): Payer: Medicaid Other | Admitting: Obstetrics and Gynecology

## 2021-06-30 VITALS — BP 116/75 | HR 67 | Wt 214.0 lb

## 2021-06-30 DIAGNOSIS — O24410 Gestational diabetes mellitus in pregnancy, diet controlled: Secondary | ICD-10-CM | POA: Diagnosis not present

## 2021-06-30 DIAGNOSIS — Z98891 History of uterine scar from previous surgery: Secondary | ICD-10-CM

## 2021-06-30 DIAGNOSIS — Z3480 Encounter for supervision of other normal pregnancy, unspecified trimester: Secondary | ICD-10-CM

## 2021-06-30 LAB — GLUCOSE, POCT (MANUAL RESULT ENTRY): POC Glucose: 148 mg/dl — AB (ref 70–99)

## 2021-06-30 NOTE — Progress Notes (Signed)
   PRENATAL VISIT NOTE  Subjective:  Shelby Paul is a 41 y.o. [id number] at [redacted]w[redacted]d being seen today for ongoing prenatal care.  She is currently monitored for the following issues for this high-risk pregnancy and has History of vitamin D deficiency; Gestational diabetes; Cesarean delivery delivered; History of gestational diabetes; History of cesarean section; and Supervision of other normal pregnancy, antepartum on their problem list.  Patient reports no complaints.  Contractions: Irritability. Vag. Bleeding: None.  Movement: Present. Denies leaking of fluid.   The following portions of the patient's history were reviewed and updated as appropriate: allergies, current medications, past family history, past medical history, past social history, past surgical history and problem list.   Objective:   Vitals:   06/30/21 0954  BP: 116/75  Pulse: 67  Weight: 214 lb (97.1 kg)    Fetal Status: Fetal Heart Rate (bpm): 148 Fundal Height: 40 cm Movement: Present     General:  Alert, oriented and cooperative. Patient is in no acute distress.  Skin: Skin is warm and dry. No rash noted.   Cardiovascular: Normal heart rate noted  Respiratory: Normal respiratory effort, no problems with respiration noted  Abdomen: Soft, gravid, appropriate for gestational age.  Pain/Pressure: Present     Pelvic: Cervical exam deferred        Extremities: Normal range of motion.  Edema: None  Mental Status: Normal mood and affect. Normal behavior. Normal judgment and thought content.   Assessment and Plan:  Pregnancy: [id number] at [redacted]w[redacted]d 1. Supervision of other normal pregnancy, antepartum Patient is doing well without complaints  2. History of cesarean section Will be scheduled for repeat c-section at 39 weeks  Patient prefers a female for her cesarean section. Informed patient that we will try to accommodate that request  3. Diet controlled gestational diabetes mellitus (GDM) in third trimester Patient not  checking CBGs Patient referred to diabetes educator CBG in the office 148- 30 minutes after a meal - Referral to Nutrition and Diabetes Services Follow up growth ultrasound  Term labor symptoms and general obstetric precautions including but not limited to vaginal bleeding, contractions, leaking of fluid and fetal movement were reviewed in detail with the patient. Please refer to After Visit Summary for other counseling recommendations.   Return in about 1 week (around 07/07/2021) for in person, ROB, High risk.  No future appointments.  Mora Bellman, MD

## 2021-07-01 ENCOUNTER — Encounter (HOSPITAL_COMMUNITY): Payer: Self-pay

## 2021-07-01 NOTE — Patient Instructions (Addendum)
Shelby Paul  07/01/2021   Your procedure is scheduled on:  6/1//2023  Arrive at 31 at Entrance C on [address] at [facility]  and [employer]. You are invited to use the FREE valet parking or use the Visitor's parking deck.  Pick up the phone at the desk and dial [phone].  Call this number if you have problems the morning of surgery: [phone]  Remember:   Do not eat food:(After Midnight) Desps de medianoche.  Do not drink clear liquids: (After Midnight) Desps de medianoche.  Take these medicines the morning of surgery with A SIP OF WATER:  none   Do not wear jewelry, make-up or nail polish.  Do not wear lotions, powders, or perfumes. Do not wear deodorant.  Do not shave 48 hours prior to surgery.  Do not bring valuables to the hospital.  [facility] is not   responsible for any belongings or valuables brought to the hospital.  Contacts, dentures or bridgework may not be worn into surgery.  Leave suitcase in the car. After surgery it may be brought to your room.  For patients admitted to the hospital, checkout time is 11:00 AM the day of              discharge.      Please read over the following fact sheets that you were given:     Preparing for Surgery

## 2021-07-03 ENCOUNTER — Ambulatory Visit: Payer: Medicaid Other

## 2021-07-03 ENCOUNTER — Ambulatory Visit: Payer: Medicaid Other | Attending: Obstetrics and Gynecology

## 2021-07-08 ENCOUNTER — Telehealth (HOSPITAL_COMMUNITY): Payer: Self-pay | Admitting: *Deleted

## 2021-07-08 ENCOUNTER — Encounter: Payer: Self-pay | Admitting: Obstetrics

## 2021-07-08 ENCOUNTER — Ambulatory Visit (INDEPENDENT_AMBULATORY_CARE_PROVIDER_SITE_OTHER): Payer: Medicaid Other | Admitting: Obstetrics

## 2021-07-08 ENCOUNTER — Encounter (HOSPITAL_COMMUNITY)
Admission: RE | Admit: 2021-07-08 | Discharge: 2021-07-08 | Disposition: A | Discharge status: Home / Self Care | Payer: Medicaid Other | Source: Ambulatory Visit | Attending: Obstetrics & Gynecology | Admitting: Obstetrics & Gynecology

## 2021-07-08 VITALS — BP 124/82 | HR 68 | Wt 215.0 lb

## 2021-07-08 DIAGNOSIS — O24410 Gestational diabetes mellitus in pregnancy, diet controlled: Secondary | ICD-10-CM

## 2021-07-08 DIAGNOSIS — Z01812 Encounter for preprocedural laboratory examination: Secondary | ICD-10-CM | POA: Diagnosis present

## 2021-07-08 DIAGNOSIS — Z98891 History of uterine scar from previous surgery: Secondary | ICD-10-CM

## 2021-07-08 DIAGNOSIS — Z3A38 38 weeks gestation of pregnancy: Secondary | ICD-10-CM

## 2021-07-08 DIAGNOSIS — O09529 Supervision of elderly multigravida, unspecified trimester: Secondary | ICD-10-CM

## 2021-07-08 LAB — CBC
HCT: 37.5 % (ref 36.0–46.0)
Hemoglobin: 12.3 g/dL (ref 12.0–15.0)
MCH: 28.1 pg (ref 26.0–34.0)
MCHC: 32.8 g/dL (ref 30.0–36.0)
MCV: 85.6 fL (ref 80.0–100.0)
Platelets: 129 10*3/uL — ABNORMAL LOW (ref 150–400)
RBC: 4.38 MIL/uL (ref 3.87–5.11)
RDW: 12.7 % (ref 11.5–15.5)
WBC: 4.8 10*3/uL (ref 4.0–10.5)
nRBC: 0 % (ref 0.0–0.2)

## 2021-07-08 LAB — TYPE AND SCREEN
ABO/RH(D): B POS
Antibody Screen: NEGATIVE

## 2021-07-08 LAB — RPR: RPR Ser Ql: NONREACTIVE

## 2021-07-08 NOTE — Progress Notes (Unsigned)
Subjective:  Shelby Paul is a 41 y.o. [id number] at [redacted]w[redacted]d being seen today for ongoing prenatal care.  She is currently monitored for the following issues for this high-risk pregnancy and has History of vitamin D deficiency; Gestational diabetes; Cesarean delivery delivered; History of gestational diabetes; History of cesarean section; and Supervision of other normal pregnancy, antepartum on their problem list.  Patient reports no complaints.   .  .  Movement: Present. Denies leaking of fluid.   The following portions of the patient's history were reviewed and updated as appropriate: allergies, current medications, past family history, past medical history, past social history, past surgical history and problem list. Problem list updated.  Objective:   Vitals:   07/08/21 1036  BP: 124/82  Weight: 215 lb (97.5 kg)    Fetal Status:     Movement: Present     General:  Alert, oriented and cooperative. Patient is in no acute distress.  Skin: Skin is warm and dry. No rash noted.   Cardiovascular: Normal heart rate noted  Respiratory: Normal respiratory effort, no problems with respiration noted  Abdomen: Soft, gravid, appropriate for gestational age. Pain/Pressure: Present     Pelvic:  Cervical exam deferred        Extremities: Normal range of motion.  Edema: None  Mental Status: Normal mood and affect. Normal behavior. Normal judgment and thought content.   Urinalysis:      Assessment and Plan:  Pregnancy: [id number] at [redacted]w[redacted]d  1. Supervision of elderly multigravida, antepartum  2. History of cesarean section - repeat C/S scheduled on 07-10-2021  3. Diet controlled gestational diabetes mellitus (GDM) in third trimester - checking CBG's sometimes, no log.  Has not had Diabetic teaching.  She says no one told her.   Term labor symptoms and general obstetric precautions including but not limited to vaginal bleeding, contractions, leaking of fluid and fetal movement were reviewed in detail  with the patient. Please refer to After Visit Summary for other counseling recommendations.   Return in about 2 weeks (around 07/22/2021) for postpartum visit.   Shelly Bombard, MD  07/08/21

## 2021-07-08 NOTE — Progress Notes (Signed)
ROB 38.5 wks Intermittent USs GDM, checking CBG's sometimes, no log Diabetes class scheduled tomorrow, C/S scheduled 07/10/21-sts no one told her about diabetes class after failed GTT-no from staff reports otherwise.

## 2021-07-08 NOTE — Telephone Encounter (Signed)
Preadmission screen  

## 2021-07-09 ENCOUNTER — Encounter: Payer: Medicaid Other | Attending: Obstetrics and Gynecology | Admitting: Registered"

## 2021-07-10 ENCOUNTER — Inpatient Hospital Stay (HOSPITAL_COMMUNITY): Payer: Medicaid Other | Admitting: Anesthesiology

## 2021-07-10 ENCOUNTER — Other Ambulatory Visit: Payer: Self-pay | Admitting: Obstetrics and Gynecology

## 2021-07-10 ENCOUNTER — Encounter (HOSPITAL_COMMUNITY)
Admission: AD | Disposition: A | Discharge status: Home / Self Care | Payer: Self-pay | Source: Home / Self Care | Attending: Obstetrics and Gynecology

## 2021-07-10 ENCOUNTER — Other Ambulatory Visit: Payer: Self-pay

## 2021-07-10 ENCOUNTER — Inpatient Hospital Stay (HOSPITAL_COMMUNITY)
Admission: AD | Admit: 2021-07-10 | Discharge: 2021-07-12 | DRG: 787 | Disposition: A | Discharge status: Home / Self Care | Payer: Medicaid Other | Attending: Obstetrics and Gynecology | Admitting: Obstetrics and Gynecology

## 2021-07-10 ENCOUNTER — Encounter (HOSPITAL_COMMUNITY): Payer: Self-pay | Admitting: Obstetrics and Gynecology

## 2021-07-10 DIAGNOSIS — O9912 Other diseases of the blood and blood-forming organs and certain disorders involving the immune mechanism complicating childbirth: Secondary | ICD-10-CM | POA: Diagnosis present

## 2021-07-10 DIAGNOSIS — O24419 Gestational diabetes mellitus in pregnancy, unspecified control: Secondary | ICD-10-CM | POA: Diagnosis present

## 2021-07-10 DIAGNOSIS — O34211 Maternal care for low transverse scar from previous cesarean delivery: Secondary | ICD-10-CM

## 2021-07-10 DIAGNOSIS — Z3A39 39 weeks gestation of pregnancy: Secondary | ICD-10-CM

## 2021-07-10 DIAGNOSIS — O24429 Gestational diabetes mellitus in childbirth, unspecified control: Secondary | ICD-10-CM | POA: Diagnosis not present

## 2021-07-10 DIAGNOSIS — O24420 Gestational diabetes mellitus in childbirth, diet controlled: Secondary | ICD-10-CM | POA: Diagnosis present

## 2021-07-10 DIAGNOSIS — D696 Thrombocytopenia, unspecified: Secondary | ICD-10-CM | POA: Diagnosis present

## 2021-07-10 DIAGNOSIS — Z98891 History of uterine scar from previous surgery: Secondary | ICD-10-CM

## 2021-07-10 LAB — GLUCOSE, CAPILLARY
Glucose-Capillary: 76 mg/dL (ref 70–99)
Glucose-Capillary: 77 mg/dL (ref 70–99)

## 2021-07-10 LAB — CREATININE, SERUM
Creatinine, Ser: 0.49 mg/dL (ref 0.44–1.00)
GFR, Estimated: >60 mL/min (ref >60)

## 2021-07-10 SURGERY — Surgical Case
Anesthesia: Spinal

## 2021-07-10 MED ORDER — STERILE WATER FOR IRRIGATION IR SOLN
Status: DC | PRN
  Administered 2021-07-10: 1

## 2021-07-10 MED ORDER — SODIUM CHLORIDE 0.9 % IR SOLN
Status: DC | PRN
  Administered 2021-07-10: 1

## 2021-07-10 MED ORDER — POVIDONE-IODINE 10 % EX SWAB
2.0000 "application " | Freq: Once | CUTANEOUS | Status: AC
  Administered 2021-07-10: 2 via TOPICAL

## 2021-07-10 MED ORDER — MAGNESIUM HYDROXIDE 400 MG/5ML PO SUSP: 30.0000 mL | ORAL | Status: DC | PRN

## 2021-07-10 MED ORDER — SENNOSIDES-DOCUSATE SODIUM 8.6-50 MG PO TABS
2.0000 | ORAL_TABLET | Freq: Every day | ORAL | Status: DC
  Administered 2021-07-11 – 2021-07-12 (×2): 2 via ORAL
  Filled 2021-07-10 (×2): qty 2

## 2021-07-10 MED ORDER — TRANEXAMIC ACID-NACL 1000-0.7 MG/100ML-% IV SOLN
INTRAVENOUS | Status: AC
  Filled 2021-07-10: qty 100

## 2021-07-10 MED ORDER — OXYTOCIN-SODIUM CHLORIDE 30-0.9 UT/500ML-% IV SOLN
INTRAVENOUS | Status: AC
  Filled 2021-07-10: qty 500

## 2021-07-10 MED ORDER — HYDROMORPHONE HCL 1 MG/ML IJ SOLN: 0.2500 mg | INTRAMUSCULAR | Status: DC | PRN

## 2021-07-10 MED ORDER — OXYCODONE HCL 5 MG/5ML PO SOLN: 5.0000 mg | Freq: Once | ORAL | Status: DC | PRN

## 2021-07-10 MED ORDER — MENTHOL 3 MG MT LOZG: 1.0000 | LOZENGE | OROMUCOSAL | Status: DC | PRN

## 2021-07-10 MED ORDER — MORPHINE SULFATE (PF) 0.5 MG/ML IJ SOLN
INTRAMUSCULAR | Status: DC | PRN
  Administered 2021-07-10: 150 ug via INTRATHECAL

## 2021-07-10 MED ORDER — CEFAZOLIN SODIUM-DEXTROSE 2-4 GM/100ML-% IV SOLN
INTRAVENOUS | Status: AC
  Filled 2021-07-10: qty 100

## 2021-07-10 MED ORDER — LACTATED RINGERS IV SOLN: INTRAVENOUS | Status: DC

## 2021-07-10 MED ORDER — DIPHENHYDRAMINE HCL 25 MG PO CAPS
25.0000 mg | ORAL_CAPSULE | ORAL | Status: DC | PRN
  Administered 2021-07-11: 25 mg via ORAL
  Filled 2021-07-10: qty 1

## 2021-07-10 MED ORDER — WITCH HAZEL-GLYCERIN EX PADS: 1.0000 "application " | MEDICATED_PAD | CUTANEOUS | Status: DC | PRN

## 2021-07-10 MED ORDER — MEPERIDINE HCL 25 MG/ML IJ SOLN
6.2500 mg | INTRAMUSCULAR | Status: AC | PRN
  Administered 2021-07-10 (×2): 6.25 mg via INTRAVENOUS

## 2021-07-10 MED ORDER — GABAPENTIN 100 MG PO CAPS
200.0000 mg | ORAL_CAPSULE | Freq: Every day | ORAL | Status: DC
  Administered 2021-07-10 – 2021-07-11 (×2): 200 mg via ORAL
  Filled 2021-07-10 (×2): qty 2

## 2021-07-10 MED ORDER — COCONUT OIL OIL
1.0000 "application " | TOPICAL_OIL | Status: DC | PRN
  Administered 2021-07-12: 1 via TOPICAL

## 2021-07-10 MED ORDER — FENTANYL CITRATE (PF) 100 MCG/2ML IJ SOLN
INTRAMUSCULAR | Status: AC
  Filled 2021-07-10: qty 2

## 2021-07-10 MED ORDER — BUPIVACAINE IN DEXTROSE 0.75-8.25 % IT SOLN
INTRATHECAL | Status: DC | PRN
  Administered 2021-07-10: 1.6 mL via INTRATHECAL

## 2021-07-10 MED ORDER — KETOROLAC TROMETHAMINE 30 MG/ML IJ SOLN
INTRAMUSCULAR | Status: AC
  Filled 2021-07-10: qty 1

## 2021-07-10 MED ORDER — DIPHENHYDRAMINE HCL 50 MG/ML IJ SOLN: 12.5000 mg | INTRAMUSCULAR | Status: DC | PRN

## 2021-07-10 MED ORDER — OXYCODONE HCL 5 MG PO TABS: 5.0000 mg | ORAL_TABLET | Freq: Once | ORAL | Status: DC | PRN

## 2021-07-10 MED ORDER — TETANUS-DIPHTH-ACELL PERTUSSIS 5-2.5-18.5 LF-MCG/0.5 IM SUSY: 0.5000 mL | PREFILLED_SYRINGE | Freq: Once | INTRAMUSCULAR | Status: DC

## 2021-07-10 MED ORDER — KETOROLAC TROMETHAMINE 30 MG/ML IJ SOLN
30.0000 mg | Freq: Four times a day (QID) | INTRAMUSCULAR | Status: AC
  Administered 2021-07-10 – 2021-07-11 (×2): 30 mg via INTRAVENOUS
  Filled 2021-07-10 (×2): qty 1

## 2021-07-10 MED ORDER — DEXAMETHASONE SODIUM PHOSPHATE 4 MG/ML IJ SOLN
INTRAMUSCULAR | Status: DC | PRN
  Administered 2021-07-10: 4 mg via INTRAVENOUS

## 2021-07-10 MED ORDER — OXYTOCIN-SODIUM CHLORIDE 30-0.9 UT/500ML-% IV SOLN
2.5000 [IU]/h | INTRAVENOUS | Status: AC
  Administered 2021-07-10: 2.5 [IU]/h via INTRAVENOUS

## 2021-07-10 MED ORDER — CEFAZOLIN SODIUM-DEXTROSE 2-4 GM/100ML-% IV SOLN: 2.0000 g | INTRAVENOUS | Status: DC

## 2021-07-10 MED ORDER — ACETAMINOPHEN 10 MG/ML IV SOLN
INTRAVENOUS | Status: AC
  Filled 2021-07-10: qty 100

## 2021-07-10 MED ORDER — MEDROXYPROGESTERONE ACETATE 150 MG/ML IM SUSP: 150.0000 mg | INTRAMUSCULAR | Status: DC | PRN

## 2021-07-10 MED ORDER — PHENYLEPHRINE HCL-NACL 20-0.9 MG/250ML-% IV SOLN
INTRAVENOUS | Status: AC
  Filled 2021-07-10: qty 250

## 2021-07-10 MED ORDER — SODIUM CHLORIDE 0.9% FLUSH: 3.0000 mL | INTRAVENOUS | Status: DC | PRN

## 2021-07-10 MED ORDER — PHENYLEPHRINE HCL-NACL 20-0.9 MG/250ML-% IV SOLN
INTRAVENOUS | Status: DC | PRN
  Administered 2021-07-10: 60 ug/min via INTRAVENOUS

## 2021-07-10 MED ORDER — ONDANSETRON HCL 4 MG/2ML IJ SOLN
INTRAMUSCULAR | Status: DC | PRN
  Administered 2021-07-10: 4 mg via INTRAVENOUS

## 2021-07-10 MED ORDER — MEPERIDINE HCL 25 MG/ML IJ SOLN
INTRAMUSCULAR | Status: AC
  Filled 2021-07-10: qty 1

## 2021-07-10 MED ORDER — SIMETHICONE 80 MG PO CHEW: 80.0000 mg | CHEWABLE_TABLET | ORAL | Status: DC | PRN

## 2021-07-10 MED ORDER — PRENATAL MULTIVITAMIN CH
1.0000 | ORAL_TABLET | Freq: Every day | ORAL | Status: DC
Start: 2021-07-11 — End: 2021-07-12
  Administered 2021-07-11 – 2021-07-12 (×2): 1 via ORAL
  Filled 2021-07-10 (×2): qty 1

## 2021-07-10 MED ORDER — IBUPROFEN 600 MG PO TABS
600.0000 mg | ORAL_TABLET | Freq: Four times a day (QID) | ORAL | Status: DC
  Administered 2021-07-11 – 2021-07-12 (×5): 600 mg via ORAL
  Filled 2021-07-10 (×5): qty 1

## 2021-07-10 MED ORDER — SOD CITRATE-CITRIC ACID 500-334 MG/5ML PO SOLN
ORAL | Status: AC
  Filled 2021-07-10: qty 30

## 2021-07-10 MED ORDER — ACETAMINOPHEN 10 MG/ML IV SOLN
INTRAVENOUS | Status: DC | PRN
  Administered 2021-07-10: 1000 mg via INTRAVENOUS

## 2021-07-10 MED ORDER — PROMETHAZINE HCL 25 MG/ML IJ SOLN: 6.2500 mg | INTRAMUSCULAR | Status: DC | PRN

## 2021-07-10 MED ORDER — MEASLES, MUMPS & RUBELLA VAC IJ SOLR: 0.5000 mL | Freq: Once | INTRAMUSCULAR | Status: DC

## 2021-07-10 MED ORDER — KETOROLAC TROMETHAMINE 30 MG/ML IJ SOLN: 30.0000 mg | Freq: Four times a day (QID) | INTRAMUSCULAR | Status: DC | PRN

## 2021-07-10 MED ORDER — KETOROLAC TROMETHAMINE 30 MG/ML IJ SOLN
30.0000 mg | Freq: Four times a day (QID) | INTRAMUSCULAR | Status: DC | PRN
  Administered 2021-07-10: 30 mg via INTRAVENOUS

## 2021-07-10 MED ORDER — DIPHENHYDRAMINE HCL 25 MG PO CAPS: 25.0000 mg | ORAL_CAPSULE | Freq: Four times a day (QID) | ORAL | Status: DC | PRN

## 2021-07-10 MED ORDER — ONDANSETRON HCL 4 MG/2ML IJ SOLN
INTRAMUSCULAR | Status: AC
  Filled 2021-07-10: qty 2

## 2021-07-10 MED ORDER — LACTATED RINGERS IV SOLN: INTRAVENOUS | Status: DC | PRN

## 2021-07-10 MED ORDER — NALOXONE HCL 4 MG/10ML IJ SOLN: 1.0000 ug/kg/h | INTRAVENOUS | Status: DC | PRN

## 2021-07-10 MED ORDER — ENOXAPARIN SODIUM 40 MG/0.4ML IJ SOSY
40.0000 mg | PREFILLED_SYRINGE | INTRAMUSCULAR | Status: DC
  Administered 2021-07-11 – 2021-07-12 (×2): 40 mg via SUBCUTANEOUS
  Filled 2021-07-10 (×2): qty 0.4

## 2021-07-10 MED ORDER — SIMETHICONE 80 MG PO CHEW
80.0000 mg | CHEWABLE_TABLET | Freq: Three times a day (TID) | ORAL | Status: DC
  Administered 2021-07-11 – 2021-07-12 (×4): 80 mg via ORAL
  Filled 2021-07-10 (×4): qty 1

## 2021-07-10 MED ORDER — SCOPOLAMINE 1 MG/3DAYS TD PT72
1.0000 | MEDICATED_PATCH | Freq: Once | TRANSDERMAL | Status: DC
  Administered 2021-07-10: 1.5 mg via TRANSDERMAL

## 2021-07-10 MED ORDER — NALOXONE HCL 0.4 MG/ML IJ SOLN: 0.4000 mg | INTRAMUSCULAR | Status: DC | PRN

## 2021-07-10 MED ORDER — SCOPOLAMINE 1 MG/3DAYS TD PT72
MEDICATED_PATCH | TRANSDERMAL | Status: AC
  Filled 2021-07-10: qty 1

## 2021-07-10 MED ORDER — CEFAZOLIN SODIUM-DEXTROSE 2-4 GM/100ML-% IV SOLN
2.0000 g | INTRAVENOUS | Status: AC
  Administered 2021-07-10: 2 g via INTRAVENOUS

## 2021-07-10 MED ORDER — TRANEXAMIC ACID-NACL 1000-0.7 MG/100ML-% IV SOLN
1000.0000 mg | INTRAVENOUS | Status: DC
Start: 2021-07-10 — End: 2021-07-10

## 2021-07-10 MED ORDER — SOD CITRATE-CITRIC ACID 500-334 MG/5ML PO SOLN
30.0000 mL | ORAL | Status: AC
  Administered 2021-07-10: 30 mL via ORAL

## 2021-07-10 MED ORDER — FENTANYL CITRATE (PF) 100 MCG/2ML IJ SOLN
INTRAMUSCULAR | Status: DC | PRN
Start: 2021-07-10 — End: 2021-07-10
  Administered 2021-07-10: 15 ug via INTRATHECAL

## 2021-07-10 MED ORDER — MORPHINE SULFATE (PF) 0.5 MG/ML IJ SOLN
INTRAMUSCULAR | Status: AC
  Filled 2021-07-10: qty 10

## 2021-07-10 MED ORDER — TRANEXAMIC ACID-NACL 1000-0.7 MG/100ML-% IV SOLN
INTRAVENOUS | Status: DC | PRN
  Administered 2021-07-10: 1000 mg via INTRAVENOUS

## 2021-07-10 MED ORDER — ACETAMINOPHEN 500 MG PO TABS
1000.0000 mg | ORAL_TABLET | Freq: Four times a day (QID) | ORAL | Status: DC
  Administered 2021-07-10 – 2021-07-12 (×7): 1000 mg via ORAL
  Filled 2021-07-10 (×8): qty 2

## 2021-07-10 MED ORDER — DIBUCAINE (PERIANAL) 1 % EX OINT: 1.0000 "application " | TOPICAL_OINTMENT | CUTANEOUS | Status: DC | PRN

## 2021-07-10 MED ORDER — ONDANSETRON HCL 4 MG/2ML IJ SOLN: 4.0000 mg | Freq: Three times a day (TID) | INTRAMUSCULAR | Status: DC | PRN

## 2021-07-10 MED ORDER — DEXAMETHASONE SODIUM PHOSPHATE 4 MG/ML IJ SOLN
INTRAMUSCULAR | Status: AC
  Filled 2021-07-10: qty 1

## 2021-07-10 MED ORDER — OXYCODONE HCL 5 MG PO TABS
5.0000 mg | ORAL_TABLET | ORAL | Status: DC | PRN
  Administered 2021-07-11 – 2021-07-12 (×3): 5 mg via ORAL
  Filled 2021-07-10 (×3): qty 1

## 2021-07-10 SURGICAL SUPPLY — 36 items
BENZOIN TINCTURE PRP APPL 2/3 (GAUZE/BANDAGES/DRESSINGS) ×2 IMPLANT
CHLORAPREP W/TINT 26ML (MISCELLANEOUS) ×4 IMPLANT
CLAMP CORD UMBIL (MISCELLANEOUS) ×2 IMPLANT
CLOSURE STERI STRIP 1/2 X4 (GAUZE/BANDAGES/DRESSINGS) ×1 IMPLANT
CLOTH BEACON ORANGE TIMEOUT ST (SAFETY) ×2 IMPLANT
DERMABOND ADVANCED (GAUZE/BANDAGES/DRESSINGS)
DERMABOND ADVANCED .7 DNX12 (GAUZE/BANDAGES/DRESSINGS) IMPLANT
DRSG OPSITE POSTOP 4X10 (GAUZE/BANDAGES/DRESSINGS) ×2 IMPLANT
ELECT REM PT RETURN 9FT ADLT (ELECTROSURGICAL) ×2
ELECTRODE REM PT RTRN 9FT ADLT (ELECTROSURGICAL) ×1 IMPLANT
EXTRACTOR VACUUM KIWI (MISCELLANEOUS) IMPLANT
GLOVE BIOGEL PI IND STRL 7.0 (GLOVE) ×3 IMPLANT
GLOVE BIOGEL PI INDICATOR 7.0 (GLOVE) ×3
GLOVE ECLIPSE 6.5 STRL STRAW (GLOVE) ×2 IMPLANT
GOWN STRL REUS W/TWL LRG LVL3 (GOWN DISPOSABLE) ×6 IMPLANT
KIT ABG SYR 3ML LUER SLIP (SYRINGE) IMPLANT
NDL HYPO 25X5/8 SAFETYGLIDE (NEEDLE) IMPLANT
NEEDLE HYPO 25X5/8 SAFETYGLIDE (NEEDLE) IMPLANT
NS IRRIG 1000ML POUR BTL (IV SOLUTION) ×2 IMPLANT
PACK C SECTION WH (CUSTOM PROCEDURE TRAY) ×2 IMPLANT
PAD ABD 7.5X8 STRL (GAUZE/BANDAGES/DRESSINGS) ×2 IMPLANT
PAD OB MATERNITY 4.3X12.25 (PERSONAL CARE ITEMS) ×2 IMPLANT
RTRCTR C-SECT PINK 25CM LRG (MISCELLANEOUS) ×2 IMPLANT
STRIP CLOSURE SKIN 1/2X4 (GAUZE/BANDAGES/DRESSINGS) ×2 IMPLANT
SUT PLAIN 0 NONE (SUTURE) IMPLANT
SUT PLAIN 2 0 XLH (SUTURE) IMPLANT
SUT VIC AB 0 CT1 27 (SUTURE) ×2
SUT VIC AB 0 CT1 27XBRD ANBCTR (SUTURE) ×2 IMPLANT
SUT VIC AB 0 CTX 36 (SUTURE) ×3
SUT VIC AB 0 CTX36XBRD ANBCTRL (SUTURE) ×3 IMPLANT
SUT VIC AB 2-0 CT1 27 (SUTURE) ×1
SUT VIC AB 2-0 CT1 TAPERPNT 27 (SUTURE) ×1 IMPLANT
SUT VIC AB 4-0 KS 27 (SUTURE) ×2 IMPLANT
TOWEL OR 17X24 6PK STRL BLUE (TOWEL DISPOSABLE) ×2 IMPLANT
TRAY FOLEY W/BAG SLVR 14FR LF (SET/KITS/TRAYS/PACK) IMPLANT
WATER STERILE IRR 1000ML POUR (IV SOLUTION) ×2 IMPLANT

## 2021-07-10 NOTE — Discharge Instructions (Signed)
[facility] (for dad for vasectomy)  [address], [place], [place] [address]  [phone]

## 2021-07-10 NOTE — H&P (Signed)
Obstetric Preoperative History and Physical  Shelby Paul is a 41 y.o. [id number] with IUP at 20w0dpresenting for scheduled cesarean section.  Reports good fetal movement, no bleeding, no contractions, no leaking of fluid.  No acute preoperative concerns.    Cesarean Section Indication:  prior C-section x 1  Prenatal Course Source of Care: [facility] Pregnancy complications or risks: -GDM -prior C-section  She plans to breastfeed She desires  unsure  for postpartum contraception.   Prenatal labs and studies: ABO, Rh: --/--/B POS (05/30 1134) Antibody: NEG (05/30 1134) Rubella: 16.70 (01/19 0918) RPR: NON REACTIVE (05/30 1133)  HBsAg: Negative (01/19 0918)  HIV: Non Reactive (03/16 0914)  GKWI:OXBDZHGD/- (05/15 0920) 1 hr Glucola  wnl Genetic screening normal Anatomy UKoreanormal  Prenatal Transfer Tool  Maternal Diabetes: Yes:  Diabetes Type:  Pre-pregnancy Genetic Screening: Normal Maternal Ultrasounds/Referrals: Normal Fetal Ultrasounds or other Referrals:  None Maternal Substance Abuse:  No Significant Maternal Medications:  None Significant Maternal Lab Results: Group B Strep negative  Past Medical History:  Diagnosis Date   Gallstones 06/2011   Gestational diabetes    Pancreatitis 06/2011    Past Surgical History:  Procedure Laterality Date   CESAREAN SECTION  12/02/2019   Procedure: CESAREAN SECTION;  Surgeon: PDonnamae Jude MD;  Location: [facility] LD ORS;  Service: Obstetrics;;    OB History  Gravida Para Term Preterm AB Living  7 5 5  0 1 5  SAB IAB Ectopic Multiple Live Births  1 0 0   5    # Outcome Date GA Lbr Len/2nd Weight Sex Delivery Anes PTL Lv  7 Current           6 Term 12/02/19 [id number] g M CS-LVertical EPI  LIV  5 Term 07/02/14    M Vag-Spont   LIV  4 Term 02/09/12 4040w3d:08 / 00:15 3371 g F Vag-Spont EPI  LIV     Birth Comments: caput  3 Term 03/14/05    F Vag-Spont   LIV  2 SAB 12/11/03          1 Term 07/03/03    F Vag-Spont   LIV      Birth Comments: 1st trimester bleeding    Social History   Socioeconomic History   Marital status: Unknown    Spouse name: Not on file   Number of children: Not on file   Years of education: Not on file   Highest education level: Not on file  Occupational History   Not on file  Tobacco Use   Smoking status: Never   Smokeless tobacco: Never  Vaping Use   Vaping Use: Never used  Substance and Sexual Activity   Alcohol use: No   Drug use: No   Sexual activity: Yes    Partners: Male  Other Topics Concern   Not on file  Social History Narrative   Not on file   Social Determinants of Health   Financial Resource Strain: Not on file  Food Insecurity: Not on file  Transportation Needs: Not on file  Physical Activity: Not on file  Stress: Not on file  Social Connections: Not on file    Family History  Problem Relation Age of Onset   Diabetes Mother    Hypertension Mother    Diabetes Father     Medications Prior to Admission  Medication Sig Dispense Refill Last Dose   blood glucose meter kit and supplies KIT Dispense based on patient and insurance preference.  Use up to four times daily as directed. 1 each 0 07/09/2021   Prenatal Vit-Fe Fumarate-FA (PRENATAL PLUS VITAMIN/MINERAL) 27-1 MG TABS Take 1 tablet by mouth daily. 30 tablet 5 07/09/2021   acetaminophen (TYLENOL) 500 MG tablet Take 500 mg by mouth every 6 (six) hours as needed (for pain.).   Unknown    No Known Allergies  Review of Systems: Pertinent items noted in HPI and remainder of comprehensive ROS otherwise negative.  Physical Exam: BP 101/73   Pulse 69   Temp 98.2 F (36.8 C) (Oral)   Resp 18   Ht 5' 6"  (1.676 m)   Wt 92.1 kg   LMP  (LMP Unknown)   SpO2 100%   Breastfeeding Yes   BMI 32.77 kg/m  FHR by Doppler: 135 bpm CONSTITUTIONAL: Well-developed, well-nourished female in no acute distress.  HENT:  Normocephalic, atraumatic, External right and left ear normal. Oropharynx is clear and  moist EYES: Conjunctivae and EOM are normal. Pupils are equal, round, and reactive to light. No scleral icterus.  NECK: Normal range of motion, supple, no masses SKIN: Skin is warm and dry. No rash noted. Not diaphoretic. No erythema. No pallor. [place]: Alert and oriented to person, place, and time. Normal reflexes, muscle tone coordination. No cranial nerve deficit noted. PSYCHIATRIC: Normal mood and affect. Normal behavior. Normal judgment and thought content. CARDIOVASCULAR: Normal heart rate noted, regular rhythm RESPIRATORY: Effort and breath sounds normal, no problems with respiration noted ABDOMEN: Soft, nontender, nondistended, gravid. Well-healed Pfannenstiel incision. PELVIC: Deferred MUSCULOSKELETAL: Normal range of motion. No edema and no tenderness. 2+ distal pulses.  Pertinent Labs/Studies:   Results for orders placed or performed during the hospital encounter of 07/10/21 (from the past 72 hour(s))  Glucose, capillary     Status: None   Collection Time: 07/10/21  1:16 PM  Result Value Ref Range   Glucose-Capillary 76 70 - 99 mg/dL    Comment: Glucose reference range applies only to samples taken after fasting for at least 8 hours.    Assessment and Plan: Shelby Paul is a 41 y.o. [id number] at 48w0dbeing admitted for scheduled cesarean section. The risks of surgery were discussed with the patient including but were not limited to: bleeding which may require transfusion or reoperation; infection which may require antibiotics; injury to bowel, bladder, ureters or other surrounding organs; injury to the fetus; need for additional procedures including hysterectomy in the event of a life-threatening hemorrhage; formation of adhesions; placental abnormalities wth subsequent pregnancies; incisional problems; thromboembolic phenomenon and other postoperative/anesthesia complications. The patient concurred with the proposed plan, giving informed written consent for the procedure. Patient  has been NPO since last night she will remain NPO for procedure. Anesthesia and OR aware. Preoperative prophylactic antibiotics and SCDs ordered on call to the OR. To OR when ready.   JOtis Peak DO O[place] FApollo Surgery Centerfor [facility] [place]

## 2021-07-10 NOTE — Op Note (Addendum)
Shelby Paul PROCEDURE DATE: 07/10/2021  PREOPERATIVE DIAGNOSIS: Intrauterine pregnancy at  [redacted]w[redacted]d weeks gestation;  previous uterine incision  POSTOPERATIVE DIAGNOSIS: The same and PPH   PROCEDURE: Repeat Low Transverse Cesarean Section  SURGEON:  Dr. Janyth Pupa   ASSISTANT: Dr. Darrelyn Hillock   An experienced assistant was required given the standard of surgical care given the complexity of the case.  This assistant was needed for exposure, dissection, suctioning, retraction, instrument exchange, assisting with delivery with administration of fundal pressure, and for overall help during the procedure.   INDICATIONS: Shelby Paul is a 41 y.o. [id number] at [redacted]w[redacted]d scheduled for cesarean section secondary to previous uterine incision.  The risks of cesarean section discussed with the patient included but were not limited to: bleeding which may require transfusion or reoperation; infection which may require antibiotics; injury to bowel, bladder, ureters or other surrounding organs; injury to the fetus; need for additional procedures including hysterectomy in the event of a life-threatening hemorrhage; placental abnormalities wth subsequent pregnancies, incisional problems, thromboembolic phenomenon and other postoperative/anesthesia complications. The patient concurred with the proposed plan, giving informed written consent for the procedure.    FINDINGS:  Viable girl infant in cephalic presentation.  Apgars 8 and 9, weight 4020g.  Clear amniotic fluid.  Intact placenta, three vessel cord.  Normal uterus, fallopian tubes and ovaries bilaterally.  ANESTHESIA:    Spinal INTRAVENOUS FLUIDS: 2,000 ml ESTIMATED BLOOD LOSS: 1200 ml URINE OUTPUT:  50 ml SPECIMENS: Placenta sent to L&D COMPLICATIONS: None immediate  PROCEDURE IN DETAIL:  The patient received intravenous antibiotics and had sequential compression devices applied to her lower extremities while in the preoperative area.  She was then  taken to the operating room where spinal anesthesia was administered and was found to be adequate. She was then placed in a dorsal supine position with a leftward tilt, and prepped and draped in a sterile manner.  A foley catheter was placed into her bladder and attached to constant gravity, which drained clear fluid throughout.  After an adequate timeout was performed, a Pfannenstiel skin incision was made with scalpel and carried through to the underlying layer of fascia. The fascia was incised in the midline and this incision was extended bilaterally using the Mayo scissors. Kocher clamps were applied to the superior aspect of the fascial incision and the underlying rectus muscles were dissected off bluntly. A similar process was carried out on the inferior aspect of the facial incision. The rectus muscles were separated in the midline bluntly and the peritoneum was entered bluntly. An Alexis retractor was placed to aid in visualization of the uterus.  Attention was turned to the lower uterine segment where a transverse hysterotomy was made with a scalpel and extended bilaterally bluntly. The infant was successfully delivered, and cord was clamped and cut and infant was handed over to awaiting neonatology team.   Uterine massage was then administered and the placenta delivered intact with three-vessel cord. The uterus was then cleared of clot and debris.  The hysterotomy was closed with 0 Vicryl in a single layer running locked fashion. A few figure of eight stitches were placed. Overall, excellent hemostasis was noted. The abdomen and the pelvis were cleared of all clot and debris and the Ubaldo Glassing was removed. Hemostasis was confirmed on all surfaces.  The peritoneum was reapproximated using 2-0 vicryl running stitches. The fascia was then closed using 0 Vicryl in a running fashion. The subcutaneous layer was reapproximated with plain gut and the skin was  closed with 4-0 vicryl. The patient tolerated the  procedure well. Sponge, lap, instrument and needle counts were correct x 2. She was taken to the recovery room in stable condition.    Patriciaann Clan, DO 07/10/2021 4:14 PM

## 2021-07-10 NOTE — Anesthesia Preprocedure Evaluation (Signed)
Anesthesia Evaluation  Patient identified by MRN, date of birth, ID band Patient awake    Reviewed: Allergy & Precautions, H&P , NPO status , Patient's Chart, lab work & pertinent test results  History of Anesthesia Complications Negative for: history of anesthetic complications  Airway Mallampati: II  TM Distance: >3 FB     Dental   Pulmonary neg pulmonary ROS,    Pulmonary exam normal        Cardiovascular negative cardio ROS   Rhythm:regular Rate:Normal     Neuro/Psych negative neurological ROS  negative psych ROS   GI/Hepatic negative GI ROS, Neg liver ROS,   Endo/Other  diabetes, Gestational  Renal/GU negative Renal ROS  negative genitourinary   Musculoskeletal   Abdominal   Peds  Hematology negative hematology ROS (+)   Anesthesia Other Findings   Reproductive/Obstetrics (+) Pregnancy [id number] at [redacted]w[redacted]d 1 prior C/S                             Anesthesia Physical Anesthesia Plan  ASA: 2  Anesthesia Plan: Spinal   Post-op Pain Management:    Induction:   PONV Risk Score and Plan: Ondansetron and Treatment may vary due to age or medical condition  Airway Management Planned:   Additional Equipment:   Intra-op Plan:   Post-operative Plan:   Informed Consent: I have reviewed the patients History and Physical, chart, labs and discussed the procedure including the risks, benefits and alternatives for the proposed anesthesia with the patient or authorized representative who has indicated his/her understanding and acceptance.       Plan Discussed with: Anesthesiologist  Anesthesia Plan Comments:         Anesthesia Quick Evaluation

## 2021-07-10 NOTE — Transfer of Care (Signed)
Immediate Anesthesia Transfer of Care Note  Patient: Shelby Paul  Procedure(s) Performed: CESAREAN SECTION  Patient Location: PACU  Anesthesia Type:Spinal  Level of Consciousness: awake, alert  and oriented  Airway & Oxygen Therapy: Patient Spontanous Breathing  Post-op Assessment: Report given to RN and Post -op Vital signs reviewed and stable  Post vital signs: Reviewed and stable  Last Vitals:  Vitals Value Taken Time  BP 108/72 07/10/21 1611  Temp    Pulse 63 07/10/21 1614  Resp 16 07/10/21 1614  SpO2 100 % 07/10/21 1614  Vitals shown include unvalidated device data.  Last Pain:  Vitals:   07/10/21 1305  TempSrc: Oral  PainSc: 5          Complications: No notable events documented.

## 2021-07-10 NOTE — Discharge Summary (Signed)
Postpartum Discharge Summary  Date of Service updated***     Patient Name: Shelby Paul DOB: [birth year] MRN: [id number]  Date of admission: 07/10/2021 Delivery date:07/10/2021  Delivering provider: Janyth Pupa  Date of discharge: 07/10/2021  Admitting diagnosis: History of cesarean delivery [Z98.891] Intrauterine pregnancy: 104w0d    Secondary diagnosis:  Principal Problem:   History of cesarean delivery Active Problems:   Gestational diabetes   History of cesarean section  Additional problems: ***    Discharge diagnosis: Term Pregnancy Delivered and GDM A1                                              Post partum procedures:{Postpartum procedures:23558} Augmentation: N/A Complications: [id number] Hospital course: Sceduled C/S   41y.o. yo [id number] 367w0das admitted to the hospital 07/10/2021 for scheduled cesarean section with the following indication:Elective Repeat.Delivery details are as follows:  Membrane Rupture Time/Date: 3:23 PM ,07/10/2021   Delivery Method:C-Section, Low Transverse  Details of operation can be found in separate operative note.  Patient had an uncomplicated postpartum course.  She is ambulating, tolerating a regular diet, passing flatus, and urinating well. Patient is discharged home in stable condition on  07/10/21        Newborn Data: Birth date:07/10/2021  Birth time:3:25 PM  Gender:Female  Living status:Living  Apgars:8 ,9  Weight:4020 g     Magnesium Sulfate received: No BMZ received: No Rhophylac:No MMR:No T-DaP:Given prenatally Flu: No Transfusion:{Transfusion received:30440034}  Physical exam  Vitals:   07/10/21 1645 07/10/21 1655 07/10/21 1700 07/10/21 1715  BP:  (!) 110/45 117/74 127/81  Pulse: 80 60 (!) 55 69  Resp: 18 (!) 21 14 11   Temp: 97.7 F (36.5 C)     TempSrc: Oral     SpO2: 100% 100% 100% 100%  Weight:      Height:       General: {Exam; general:21111117} Lochia: {Desc;  appropriate/inappropriate:30686::"appropriate"} Uterine Fundus: {Desc; firm/soft:30687} Incision: {Exam; incision:21111123} DVT Evaluation: {Exam; dvt:2111122} Labs: Lab Results  Component Value Date   WBC 4.8 07/08/2021   HGB 12.3 07/08/2021   HCT 37.5 07/08/2021   MCV 85.6 07/08/2021   PLT 129 (L) 07/08/2021      Latest Ref Rng & Units 05/29/2019    9:30 AM  CMP  Glucose 70 - 99 mg/dL 121    BUN 6 - 20 mg/dL 6    Creatinine 0.44 - 1.00 mg/dL 0.43    Sodium 135 - 145 mmol/L 133    Potassium 3.5 - 5.1 mmol/L 3.8    Chloride 98 - 111 mmol/L 104    CO2 22 - 32 mmol/L 22    Calcium 8.9 - 10.3 mg/dL 8.6    Total Protein 6.5 - 8.1 g/dL 6.2    Total Bilirubin 0.3 - 1.2 mg/dL 0.4    Alkaline Phos 38 - 126 U/L 34    AST 15 - 41 U/L 17    ALT 0 - 44 U/L 20     Edinburgh Score:    01/15/2020    8:46 AM  Edinburgh Postnatal Depression Scale Screening Tool  I have been able to laugh and see the funny side of things. 0  I have looked forward with enjoyment to things. 0  I have blamed myself unnecessarily when things went wrong. 0  I have been anxious or  worried for no good reason. 0  I have felt scared or panicky for no good reason. 0  Things have been getting on top of me. 0  I have been so unhappy that I have had difficulty sleeping. 0  I have felt sad or miserable. 1  I have been so unhappy that I have been crying. 0  The thought of harming myself has occurred to me. 0  Edinburgh Postnatal Depression Scale Total 1     After visit meds:  Allergies as of 07/10/2021   No Known Allergies   Med Rec must be completed prior to using this [facility]***        Discharge home in stable condition Infant Feeding: {Baby feeding:23562} Infant Disposition:{CHL IP OB HOME WITH XOGACG:98473} Discharge instruction: per After Visit Summary and Postpartum booklet. Activity: Advance as tolerated. Pelvic rest for 6 weeks.  Diet: {OB GYLU:94370052} Future Appointments:No future  appointments. Follow up Visit:  Message sent to [facility] by Dr Higinio Plan:  Please schedule this patient for a In person postpartum visit in 6 weeks with the following provider: Any provider. Additional Postpartum F/U:2 hour GTT and Incision check 1 week  High risk pregnancy complicated by: GDM Delivery mode:  C-Section, Low Transverse  Anticipated Birth Control:  Unsure   07/10/2021 Patriciaann Clan, DO

## 2021-07-10 NOTE — Anesthesia Procedure Notes (Signed)
Spinal ° °Patient location during procedure: OR °Reason for block: surgical anesthesia °Staffing °Performed: anesthesiologist  °Anesthesiologist: Dustine Stickler E, MD °Preanesthetic Checklist °Completed: patient identified, IV checked, risks and benefits discussed, surgical consent, monitors and equipment checked, pre-op evaluation and timeout performed °Spinal Block °Patient position: sitting °Prep: DuraPrep and site prepped and draped °Patient monitoring: continuous pulse ox, blood pressure and heart rate °Approach: midline °Location: L3-4 °Injection technique: single-shot °Needle °Needle type: Pencan  °Needle gauge: 24 G °Needle length: 10 cm °Assessment °Events: CSF return °Additional Notes °Functioning IV was confirmed and monitors were applied. Sterile prep and drape, including hand hygiene and sterile gloves were used. The patient was positioned and the spine was prepped. The skin was anesthetized with lidocaine.  Free flow of clear CSF was obtained prior to injecting local anesthetic into the CSF. The needle was carefully withdrawn. The patient tolerated the procedure well.  ° ° ° °

## 2021-07-11 ENCOUNTER — Encounter (HOSPITAL_COMMUNITY): Payer: Self-pay | Admitting: Obstetrics and Gynecology

## 2021-07-11 DIAGNOSIS — D696 Thrombocytopenia, unspecified: Secondary | ICD-10-CM

## 2021-07-11 HISTORY — DX: Thrombocytopenia, unspecified: D69.6

## 2021-07-11 LAB — CBC
HCT: 28.6 % — ABNORMAL LOW (ref 36.0–46.0)
Hemoglobin: 9.7 g/dL — ABNORMAL LOW (ref 12.0–15.0)
MCH: 28.9 pg (ref 26.0–34.0)
MCHC: 33.9 g/dL (ref 30.0–36.0)
MCV: 85.1 fL (ref 80.0–100.0)
Platelets: 113 10*3/uL — ABNORMAL LOW (ref 150–400)
RBC: 3.36 MIL/uL — ABNORMAL LOW (ref 3.87–5.11)
RDW: 12.8 % (ref 11.5–15.5)
WBC: 10.3 10*3/uL (ref 4.0–10.5)
nRBC: 0 % (ref 0.0–0.2)

## 2021-07-11 NOTE — Progress Notes (Incomplete)
Subjective: Postpartum Day 2: Cesarean Delivery Patient reports incisional pain denies flatus and BM. Patient states that she is having pain unmanaged by PO Ibuprofen and Tylenol. States she only voided 3 times yesterday.      Objective: Vital signs in last 24 hours: Temp:  [97.8 F (36.6 C)-98.6 F (37 C)] 97.8 F (36.6 C) (06/03 0559) Pulse Rate:  [61-77] 61 (06/03 0559) Resp:  [16-18] 16 (06/03 0559) BP: (94-101)/(57-74) 101/62 (06/03 0559) SpO2:  [99 %-100 %] 100 % (06/03 0559)  Physical Exam:  General: alert, cooperative, and no distress Lochia: appropriate Uterine Fundus: firm below umbilicus.  Incision: no significant drainage, no significant erythema DVT Evaluation: No evidence of DVT seen on physical exam. + Bowel sounds on exam.   Recent Labs    07/11/21 0519  HGB 9.7*  HCT 28.6*    Assessment/Plan: Status post Cesarean section. Doing well postoperatively.  Continue current care.  - CNM encouraged frequent bladder emptying to decrease bleeding.  - Recommended ambulation to encouraged gut motility.  - Informed patient of pain management medications, and encouraged the use of Oxycodone if pain continues to be uncontrolled with PO meds.   Claudette Head, CNM  07/12/2021, 8:42 AM

## 2021-07-11 NOTE — Social Work (Addendum)
CSW received consult for hx of Anxiety and Depression.  CSW met with MOB to offer support and complete assessment.    CSW met with MOB at bedside and introduced CSW role. CSW observed MOB sitting up in the bed, infant asleep in the bassinet and FOB present at bedside. MOB gave CSW permission to share all information with FOB present. CSW inquired how MOB has felt since giving birth. MOB reported, "Not bad." MOB shared the L&D was good. MOB reported, "the pregnancy was not bad." CSW inquired about MOB history of anxiety and depression. MOB reported she does not have a history of anxiety and depression. MOB reported she was surprised anxiety and depression was noted in her chart. CSW encouraged MOB to talk with her medical provider about the diagnosis listed. MOB reported no concerns. MOB denied thoughts of harm to self and others. MOB identified FOB as her support.   CSW provided education regarding the baby blues period vs. perinatal mood disorders, discussed treatment and gave resources for mental health follow up if concerns arise.  CSW recommended MOB complete a self-evaluation during the postpartum time period using the New Mom Checklist from Postpartum Progress and encouraged MOB to contact a medical professional if symptoms are noted at any time.    CSW provided review of Sudden Infant Death Syndrome (SIDS) precautions. MOB reported she has essential items for the infant including a crib where the infant will sleep. MOB has chosen [place] for the infants follow up care.   CSW identifies no further need for intervention and no barriers to discharge at this time.   Kathrin Greathouse, MSW, LCSW [facility] Worker  [phone] 07/11/2021  11:50 AM

## 2021-07-11 NOTE — Progress Notes (Signed)
Subjective: Postpartum Day 1: Cesarean Delivery Patient reports incisional pain and tolerating PO.    Objective: Vital signs in last 24 hours: Temp:  [97.3 F (36.3 C)-98.7 F (37.1 C)] 97.3 F (36.3 C) (06/02 0755) Pulse Rate:  [55-80] 57 (06/02 0755) Resp:  [11-21] 18 (06/02 0755) BP: (96-127)/(45-81) 106/75 (06/02 0755) SpO2:  [100 %] 100 % (06/02 0755) Weight:  [92.1 kg] 92.1 kg (06/01 1305)  Physical Exam:  General: alert, cooperative, and no distress Lochia: appropriate Uterine Fundus: firm Incision: healing well DVT Evaluation: No evidence of DVT seen on physical exam.  Recent Labs    07/08/21 1133 07/11/21 0519  HGB 12.3 9.7*  HCT 37.5 28.6*   Platelets 129 > 113  Assessment/Plan: Status post Cesarean section. Doing well postoperatively.  Thrombocytopenia Continue current care.  Wynelle Bourgeois 07/11/2021, 8:18 AM

## 2021-07-11 NOTE — Lactation Note (Signed)
This note was copied from a baby's chart. Lactation Consultation Note  Patient Name: Shelby Paul [id number] Date: 07/11/2021 Reason for consult: Initial assessment;Term;Breastfeeding assistance Age:41 years   P6 mother whose infant is now 41 years old.  This is a term baby at 39+0 weeks.  Mother's current feeding preference is breast.  She breast fed all of her other children from 1-2 years each.  The youngest is now 22 months old.  RN requested latch assistance.  Baby (No name yet) was asleep in the bassinet when I arrived.  Mother interested in having assistance to awaken and latch baby.  Reviewed hand expression with mother.  Attempted to latch, however, baby remained very sleepy and showed no interest in opening her mouth for latching.  Reassurance given.  Placed her STS on mother's chest where she fell asleep.  Encouraged mother to continue lots of STS and to practice hand expression.  She will feed back any colostrum drops she obtains to baby.  Suggested she call her RN/LC for assistance as needed.  Parents appreciative.  Mother plans to call the [facility] office today.   Maternal Data Has patient been taught Hand Expression?: Yes Does the patient have breastfeeding experience prior to this delivery?: Yes How long did the patient breastfeed?: 1-2 years with each of her other children; youngest one is 18 months and she breast fed for one year  Feeding Mother's Current Feeding Choice: Breast Milk  LATCH Score Latch: Too sleepy or reluctant, no latch achieved, no sucking elicited.  Audible Swallowing: None  Type of Nipple: Everted at rest and after stimulation  Comfort (Breast/Nipple): Soft / non-tender  Hold (Positioning): Assistance needed to correctly position infant at breast and maintain latch.  LATCH Score: 5   Lactation Tools Discussed/Used    Interventions Interventions: Breast feeding basics reviewed;Assisted with latch;Skin to skin;Breast massage;Hand  express;Position options;Support pillows;Adjust position;LC Services brochure;Education  Discharge WIC Program: Yes  Consult Status Consult Status: Follow-up Date: 07/12/21 Follow-up type: In-patient    Yani Lal R Tahmid Stonehocker 07/11/2021, 6:15 AM

## 2021-07-11 NOTE — Anesthesia Postprocedure Evaluation (Signed)
Anesthesia Post Note  Patient: Shelby Paul  Procedure(s) Performed: CESAREAN SECTION     Patient location during evaluation: PACU Anesthesia Type: Spinal Level of consciousness: oriented and awake and alert Pain management: pain level controlled Vital Signs Assessment: post-procedure vital signs reviewed and stable Respiratory status: spontaneous breathing, respiratory function stable and nonlabored ventilation Cardiovascular status: blood pressure returned to baseline and stable Postop Assessment: no headache, no backache, no apparent nausea or vomiting and spinal receding Anesthetic complications: no   No notable events documented.  Last Vitals:  Vitals:   07/11/21 0315 07/11/21 0755  BP: 105/71 106/75  Pulse: 62 (!) 57  Resp: 16 18  Temp: 36.9 C (!) 36.3 C  SpO2: 100% 100%    Last Pain:  Vitals:   07/11/21 1350  TempSrc:   PainSc: 4                  Lucretia Kern

## 2021-07-12 MED ORDER — IBUPROFEN 600 MG PO TABS: 600.0000 mg | ORAL_TABLET | Freq: Four times a day (QID) | ORAL | 0 refills | Status: DC | PRN

## 2021-07-12 MED ORDER — ACETAMINOPHEN 500 MG PO TABS: 1000.0000 mg | ORAL_TABLET | Freq: Four times a day (QID) | ORAL | 0 refills | Status: AC

## 2021-07-12 MED ORDER — OXYCODONE HCL 5 MG PO TABS: 5.0000 mg | ORAL_TABLET | Freq: Four times a day (QID) | ORAL | 0 refills | Status: DC | PRN

## 2021-07-12 NOTE — Lactation Note (Signed)
This note was copied from a baby's chart. Lactation Consultation Note  Patient Name: Shelby Paul [id number] Date: 07/12/2021 Reason for consult: Follow-up assessment;Term;Infant weight loss;Other (Comment) (3% weight loss, per mom breast feeding going well, nipples are alittle sore . LC recommended prior to latching  1st breast hand express, prepump if needed/ showed mom reverse pressure. Mom asked for a hand pump, LC reviewed BF D/C teaching and resources.) Age:67 hours  Maternal Data Has patient been taught Hand Expression?: Yes  Feeding Mother's Current Feeding Choice: Breast Milk and Formula  LATCH Score                    Lactation Tools Discussed/Used    Interventions Interventions: Breast feeding basics reviewed;Hand pump;Education;LC Services brochure  Discharge Discharge Education: Engorgement and breast care;Warning signs for feeding baby Pump: Manual WIC Program: Yes  Consult Status Consult Status: Complete Date: 07/12/21    Kathrin Greathouse 07/12/2021, 10:59 AM

## 2021-07-18 ENCOUNTER — Ambulatory Visit (INDEPENDENT_AMBULATORY_CARE_PROVIDER_SITE_OTHER): Payer: Medicaid Other | Admitting: *Deleted

## 2021-07-18 ENCOUNTER — Other Ambulatory Visit: Payer: Medicaid Other

## 2021-07-18 VITALS — BP 125/88 | HR 76 | Ht 66.0 in | Wt 194.0 lb

## 2021-07-18 DIAGNOSIS — Z98891 History of uterine scar from previous surgery: Secondary | ICD-10-CM

## 2021-07-18 MED ORDER — FERROUS SULFATE 325 (65 FE) MG PO TABS: 325.0000 mg | ORAL_TABLET | ORAL | 3 refills | Status: DC

## 2021-07-18 NOTE — Progress Notes (Cosign Needed)
Subjective:     Shelby Paul is a 41 y.o. female who presents to the clinic 8 weeks status post low uterine, transverse cesarean section. Pt reports incision is healing well. Patient has been having difficulty with sleep. Patient had EBL 1200 cc at delivery. Hgb 9/ Hct 28.     Objective:    There were no vitals taken for this visit. General:  alert, well appearing, in no apparent distress, oriented to person, place and time, very sleepy  Incision:   healing well, no drainage, no erythema, no hernia, no seroma, no swelling, well approximated, no dehiscence, incision well approximated     Assessment:    Doing well postoperatively.    Plan:   1. Continue any current medications. 2. Wound care discussed. 3. Follow up: at 6 wk appt 4. Begin taking ZzzQuill for sleep. OK per Dr. Clearance Coots 5. Iron supplement prescribed per Dr. Clearance Coots. Harrel Lemon, RN

## 2021-08-26 ENCOUNTER — Ambulatory Visit: Payer: Medicaid Other | Admitting: Advanced Practice Midwife

## 2021-09-08 ENCOUNTER — Encounter: Payer: Self-pay | Admitting: Obstetrics and Gynecology

## 2021-09-08 ENCOUNTER — Ambulatory Visit (INDEPENDENT_AMBULATORY_CARE_PROVIDER_SITE_OTHER): Payer: Medicaid Other | Admitting: Obstetrics and Gynecology

## 2021-09-08 DIAGNOSIS — Z3480 Encounter for supervision of other normal pregnancy, unspecified trimester: Secondary | ICD-10-CM

## 2021-09-08 DIAGNOSIS — Z3042 Encounter for surveillance of injectable contraceptive: Secondary | ICD-10-CM

## 2021-09-08 MED ORDER — PRENATAL PLUS VITAMIN/MINERAL 27-1 MG PO TABS: 1.0000 | ORAL_TABLET | Freq: Every day | ORAL | 5 refills | Status: AC

## 2021-09-08 MED ORDER — MEDROXYPROGESTERONE ACETATE 150 MG/ML IM SUSP
150.0000 mg | INTRAMUSCULAR | 5 refills | Status: AC
Start: 2021-09-08 — End: ?

## 2021-09-08 MED ORDER — MEDROXYPROGESTERONE ACETATE 150 MG/ML IM SUSP
150.0000 mg | Freq: Once | INTRAMUSCULAR | Status: AC
  Administered 2021-09-08: 150 mg via INTRAMUSCULAR

## 2021-09-08 NOTE — Addendum Note (Signed)
Addended by: Harrel Lemon on: 09/08/2021 01:20 PM   Modules accepted: Orders

## 2021-09-08 NOTE — Progress Notes (Signed)
Post Partum Visit Note  Shelby Paul is a 41 y.o. [phone] female who presents for a postpartum visit. She is 8 weeks postpartum following a repeat cesarean section.  I have fully reviewed the prenatal and intrapartum course. The delivery was at 39 gestational weeks.  Anesthesia: spinal. Postpartum course has been uncomplicated. Baby is doing well. Baby is feeding by breast. Bleeding no bleeding. Bowel function is normal. Bladder function is normal. Patient is not sexually active. Contraception method is none. Postpartum depression screening: negative.   The pregnancy intention screening data noted above was reviewed. Potential methods of contraception were discussed. The patient elected to proceed with No data recorded.   Edinburgh Postnatal Depression Scale - 09/08/21 1119       Edinburgh Postnatal Depression Scale:  In the Past 7 Days   I have been able to laugh and see the funny side of things. 0    I have looked forward with enjoyment to things. 0    I have blamed myself unnecessarily when things went wrong. 0    I have been anxious or worried for no good reason. 0    I have felt scared or panicky for no good reason. 0    Things have been getting on top of me. 0    I have been so unhappy that I have had difficulty sleeping. 0    I have felt sad or miserable. 0    I have been so unhappy that I have been crying. 0    The thought of harming myself has occurred to me. 0    Edinburgh Postnatal Depression Scale Total 0             Health Maintenance Due  Topic Date Due   COVID-19 Vaccine (1) Never done   URINE MICROALBUMIN  Never done   PAP SMEAR-Modifier  Never done       Review of Systems Pertinent items noted in HPI and remainder of comprehensive ROS otherwise negative.  Objective:  BP 102/71   Pulse 63   Ht 5\' 6"  (1.676 m)   Wt 200 lb 3.2 oz (90.8 kg)   LMP 08/18/2021 (Approximate) Comment: has pp bleeding 2 wks, then no bleeding 3-4 weeks, then bleeding   Breastfeeding Yes   BMI 32.31 kg/m    General:  alert   Breasts:  normal  Lungs: clear to auscultation bilaterally  Heart:  regular rate and rhythm  Abdomen: soft, non-tender; bowel sounds normal; no masses,  no organomegaly   Wound well approximated incision  GU exam:  normal       Assessment:    1. Routine postpartum follow-up Normal postpartum exam.   Plan:   Essential components of care per ACOG recommendations:  1.  Mood and well being: Patient with negative depression screening today. Reviewed local resources for support.  - Patient tobacco use? No.   - hx of drug use? No.    2. Infant care and feeding:  -Patient currently breastmilk feeding? Yes. Reviewed importance of draining breast regularly to support lactation.  -Social determinants of health (SDOH) reviewed in EPIC. No concerns  3. Sexuality, contraception and birth spacing - Patient does not want a pregnancy in the next year.  Desired family size is 6 children.  - Reviewed reproductive life planning. Reviewed contraceptive methods based on pt preferences and effectiveness.  Patient desired depo-provera today.   - Discussed birth spacing of 18 months  4. Sleep and fatigue -Encouraged family/partner/community support  of 4 hrs of uninterrupted sleep to help with mood and fatigue  5. Physical Recovery  - Discussed patients delivery and complications. She describes her labor as good. - Patient has urinary incontinence? No. - Patient is safe to resume physical and sexual activity  6.  Health Maintenance - HM due items addressed Yes - Last pap smear No results found for: "DIAGPAP" Pap smear done at today's visit.  -Breast Cancer screening indicated? Yes. Patient referred today for mammogram.   7. Chronic Disease/Pregnancy Condition follow up: Gestational Diabetes Patient will return for 2 hour glucola  - PCP follow up  Catalina Antigua, MD Center for [facility], [facility]

## 2021-09-09 ENCOUNTER — Other Ambulatory Visit (HOSPITAL_COMMUNITY)
Admission: RE | Admit: 2021-09-09 | Discharge: 2021-09-09 | Disposition: A | Discharge status: Home / Self Care | Payer: Medicaid Other | Source: Ambulatory Visit | Attending: Obstetrics & Gynecology | Admitting: Obstetrics & Gynecology

## 2021-09-09 ENCOUNTER — Other Ambulatory Visit: Payer: Self-pay

## 2021-09-09 DIAGNOSIS — Z124 Encounter for screening for malignant neoplasm of cervix: Secondary | ICD-10-CM

## 2021-09-10 NOTE — Addendum Note (Signed)
Addended by: Natale Milch D on: 09/10/2021 12:36 PM   Modules accepted: Orders

## 2021-09-10 NOTE — Addendum Note (Signed)
Addended by: Jearld Adjutant on: 09/10/2021 12:11 PM   Modules accepted: Orders

## 2021-09-11 LAB — CYTOLOGY - PAP
Adequacy: ABSENT
Comment: NEGATIVE
Diagnosis: NEGATIVE
High risk HPV: NEGATIVE

## 2021-09-16 ENCOUNTER — Other Ambulatory Visit: Payer: Medicaid Other

## 2021-12-02 ENCOUNTER — Ambulatory Visit: Payer: Medicaid Other

## 2021-12-04 ENCOUNTER — Ambulatory Visit (INDEPENDENT_AMBULATORY_CARE_PROVIDER_SITE_OTHER): Payer: Medicaid Other

## 2021-12-04 DIAGNOSIS — Z3042 Encounter for surveillance of injectable contraceptive: Secondary | ICD-10-CM

## 2021-12-04 MED ORDER — MEDROXYPROGESTERONE ACETATE 150 MG/ML IM SUSP
150.0000 mg | Freq: Once | INTRAMUSCULAR | Status: AC
  Administered 2021-12-04: 150 mg via INTRAMUSCULAR

## 2021-12-04 NOTE — Progress Notes (Signed)
Patient was assessed and managed by nursing staff during this encounter. I have reviewed the chart and agree with the documentation and plan. I have also made any necessary editorial changes.  Griffin Basil, MD 12/04/2021 10:20 AM

## 2021-12-04 NOTE — Progress Notes (Signed)
Date last pap: 09/09/21. Last Depo-Provera: 09/08/21. Side Effects if any: N/A. Serum HCG indicated? N/A. Depo-Provera 150 mg IM given by: Elyn Peers, RN. Given in RD. Patient tolerated well. Next appointment due 1/11-1/25.

## 2022-02-04 ENCOUNTER — Telehealth: Payer: Self-pay | Admitting: Emergency Medicine

## 2022-02-04 NOTE — Telephone Encounter (Signed)
Has concerns with heavy bleeding on Depo for 2 months.  Forwarded to front desk to schedule provider apt.

## 2022-02-10 ENCOUNTER — Ambulatory Visit: Payer: Medicaid Other | Admitting: Student

## 2022-02-16 ENCOUNTER — Ambulatory Visit (INDEPENDENT_AMBULATORY_CARE_PROVIDER_SITE_OTHER): Payer: Medicaid Other | Admitting: Obstetrics and Gynecology

## 2022-02-16 VITALS — BP 124/83 | HR 57 | Wt 220.0 lb

## 2022-02-16 DIAGNOSIS — Z3009 Encounter for other general counseling and advice on contraception: Secondary | ICD-10-CM

## 2022-02-16 MED ORDER — VITAFOL ULTRA 29-0.6-0.4-200 MG PO CAPS: 1.0000 | ORAL_CAPSULE | Freq: Every day | ORAL | 12 refills | Status: AC

## 2022-02-16 MED ORDER — NORETHINDRONE 0.35 MG PO TABS
1.0000 | ORAL_TABLET | Freq: Every day | ORAL | 11 refills | Status: AC
Start: 2022-02-16 — End: ?

## 2022-02-16 MED ORDER — CELECOXIB 200 MG PO CAPS: 200.0000 mg | ORAL_CAPSULE | Freq: Two times a day (BID) | ORAL | 0 refills | Status: AC

## 2022-02-16 MED ORDER — FERROUS SULFATE 325 (65 FE) MG PO TABS: 325.0000 mg | ORAL_TABLET | ORAL | 3 refills | Status: AC

## 2022-02-16 NOTE — Progress Notes (Signed)
42 yo P6 here for sterilization consultation. Patient reports AUB with depo provera. She is not interested in LARC or any other forms of contraception. Patient is without any other complaints  Past Medical History:  Diagnosis Date   Gallstones 06/2011   Gestational diabetes    Pancreatitis 06/2011   Thrombocytopenia ([place]) 07/11/2021   Past Surgical History:  Procedure Laterality Date   CESAREAN SECTION  12/02/2019   Procedure: CESAREAN SECTION;  Surgeon: Donnamae Jude, MD;  Location: [facility];  Service: Obstetrics;;   CESAREAN SECTION N/A 07/10/2021   Procedure: CESAREAN SECTION;  Surgeon: Janyth Pupa, DO;  Location: [facility];  Service: Obstetrics;  Laterality: N/A;   Family History  Problem Relation Age of Onset   Diabetes Mother    Hypertension Mother    Diabetes Father    Social History   Tobacco Use   Smoking status: Never   Smokeless tobacco: Never  Vaping Use   Vaping Use: Never used  Substance Use Topics   Alcohol use: No   Drug use: No   ROS See pertinent in HPI. All other systems reviewed and non contributory Blood pressure 124/83, pulse (!) 57, weight 220 lb (99.8 kg), currently breastfeeding. GENERAL: Well-developed, well-nourished female in no acute distress.  NEURO: alert and oriented x 3  A/P 42 yo with AUB with depo-provera Patient desires permanent sterilization. Other reversible forms of contraception were discussed with patient; she declines all other modalities.  Risks of procedure discussed with patient including permanence of method, bleeding, infection, injury to surrounding organs and need for additional procedures including laparotomy, risk of regret.  Failure risk of 0.5-1% with increased risk of ectopic gestation if pregnancy occurs was also discussed with patient.    Patient agrees with POP until surgery

## 2022-02-16 NOTE — Progress Notes (Signed)
Pt last Depo was in October, pt has been bleeding since.  Pt states will be light to heavy.  Pt does not want to continue with Depo.

## 2022-02-17 ENCOUNTER — Other Ambulatory Visit: Payer: Self-pay | Admitting: Obstetrics and Gynecology

## 2022-02-17 DIAGNOSIS — Z01812 Encounter for preprocedural laboratory examination: Secondary | ICD-10-CM

## 2022-02-26 ENCOUNTER — Ambulatory Visit: Payer: Medicaid Other

## 2022-03-15 ENCOUNTER — Encounter (HOSPITAL_BASED_OUTPATIENT_CLINIC_OR_DEPARTMENT_OTHER): Payer: Self-pay

## 2022-03-15 ENCOUNTER — Other Ambulatory Visit: Payer: Self-pay

## 2022-03-15 ENCOUNTER — Emergency Department (HOSPITAL_BASED_OUTPATIENT_CLINIC_OR_DEPARTMENT_OTHER)
Admission: EM | Admit: 2022-03-15 | Discharge: 2022-03-15 | Disposition: A | Discharge status: Home / Self Care | Payer: Medicaid Other | Attending: Emergency Medicine | Admitting: Emergency Medicine

## 2022-03-15 DIAGNOSIS — J111 Influenza due to unidentified influenza virus with other respiratory manifestations: Secondary | ICD-10-CM

## 2022-03-15 DIAGNOSIS — R509 Fever, unspecified: Secondary | ICD-10-CM | POA: Diagnosis present

## 2022-03-15 DIAGNOSIS — J101 Influenza due to other identified influenza virus with other respiratory manifestations: Secondary | ICD-10-CM | POA: Insufficient documentation

## 2022-03-15 DIAGNOSIS — Z1152 Encounter for screening for COVID-19: Secondary | ICD-10-CM | POA: Insufficient documentation

## 2022-03-15 LAB — RESP PANEL BY RT-PCR (RSV, FLU A&B, COVID)  RVPGX2
Influenza A by PCR: NEGATIVE
Influenza B by PCR: POSITIVE — AB
Resp Syncytial Virus by PCR: NEGATIVE
SARS Coronavirus 2 by RT PCR: NEGATIVE

## 2022-03-15 LAB — GROUP A STREP BY PCR: Group A Strep by PCR: NOT DETECTED

## 2022-03-15 MED ORDER — OSELTAMIVIR PHOSPHATE 75 MG PO CAPS: 75.0000 mg | ORAL_CAPSULE | Freq: Two times a day (BID) | ORAL | 0 refills | Status: DC

## 2022-03-15 MED ORDER — OXYMETAZOLINE HCL 0.05 % NA SOLN
2.0000 | Freq: Once | NASAL | Status: AC
  Administered 2022-03-15: 2 via NASAL
  Filled 2022-03-15: qty 30

## 2022-03-15 MED ORDER — IBUPROFEN 800 MG PO TABS: 800.0000 mg | ORAL_TABLET | Freq: Three times a day (TID) | ORAL | 0 refills | Status: AC | PRN

## 2022-03-15 MED ORDER — IBUPROFEN 800 MG PO TABS
800.0000 mg | ORAL_TABLET | Freq: Once | ORAL | Status: AC
Start: 2022-03-15 — End: 2022-03-15
  Administered 2022-03-15: 800 mg via ORAL
  Filled 2022-03-15: qty 1

## 2022-03-15 NOTE — ED Provider Notes (Signed)
[facility] Provider Note   CSN: [id number] Arrival date & time: 03/15/22  1154     History  Chief Complaint  Patient presents with   Fever    Shelby Paul is a 42 y.o. female.  Patient is a 42yo female presenting for fevers, cough, sore throat, nasal congestion, and body aches x 2 days. Denies nausea, vomiting, abdominal pain, or diarrhea.   The history is provided by the patient. No language interpreter was used.  Fever Associated symptoms: congestion, cough, myalgias and sore throat   Associated symptoms: no chest pain, no chills, no dysuria, no ear pain, no rash and no vomiting        Home Medications Prior to Admission medications   Medication Sig Start Date End Date Taking? Authorizing Provider  oseltamivir (TAMIFLU) 75 MG capsule Take 1 capsule (75 mg total) by mouth every 12 (twelve) hours. 05/14/01  Yes Campbell Stall P, DO  acetaminophen (TYLENOL) 500 MG tablet Take 2 tablets (1,000 mg total) by mouth every 6 (six) hours. Patient not taking: Reported on 02/16/2022 07/12/21   Patriciaann Clan, DO  ferrous sulfate (FERROUSUL) 325 (65 FE) MG tablet Take 1 tablet (325 mg total) by mouth every other day. Take with orange juice or vitamin C 500mg  02/16/22   Constant, Peggy, MD  ibuprofen (ADVIL) 600 MG tablet Take 1 tablet (600 mg total) by mouth every 6 (six) hours as needed. Patient not taking: Reported on 02/16/2022 07/12/21   Patriciaann Clan, DO  medroxyPROGESTERone (DEPO-PROVERA) 150 MG/ML injection Inject 1 mL (150 mg total) into the muscle every 3 (three) months. Patient not taking: Reported on 02/16/2022 09/08/21   Constant, Peggy, MD  norethindrone (MICRONOR) 0.35 MG tablet Take 1 tablet (0.35 mg total) by mouth daily. 02/16/22   Constant, Peggy, MD  Prenat-Fe Poly-Methfol-FA-DHA (VITAFOL ULTRA) 29-0.6-0.4-200 MG CAPS Take 1 tablet by mouth daily. 02/16/22   Constant, Peggy, MD  Prenatal Vit-Fe Fumarate-FA (PRENATAL PLUS VITAMIN/MINERAL)  27-1 MG TABS Take 1 tablet by mouth daily. Patient not taking: Reported on 02/16/2022 09/08/21   Constant, Peggy, MD      Allergies    Patient has no known allergies.    Review of Systems   Review of Systems  Constitutional:  Positive for fever. Negative for chills.  HENT:  Positive for congestion, sinus pain and sore throat. Negative for ear pain.   Eyes:  Negative for pain and visual disturbance.  Respiratory:  Positive for cough. Negative for shortness of breath.   Cardiovascular:  Negative for chest pain and palpitations.  Gastrointestinal:  Negative for abdominal pain and vomiting.  Genitourinary:  Negative for dysuria and hematuria.  Musculoskeletal:  Positive for myalgias. Negative for arthralgias and back pain.  Skin:  Negative for color change and rash.  Neurological:  Negative for seizures and syncope.  All other systems reviewed and are negative.   Physical Exam Updated Vital Signs BP 124/85   Pulse (!) 102   Temp 100.1 F (37.8 C) (Oral)   Resp 16   Wt 90.7 kg   LMP  (LMP Unknown)   SpO2 100%   BMI 32.28 kg/m  Physical Exam Vitals and nursing note reviewed.  Constitutional:      General: She is not in acute distress.    Appearance: She is well-developed.  HENT:     Head: Normocephalic and atraumatic.  Eyes:     Conjunctiva/sclera: Conjunctivae normal.  Cardiovascular:     Rate and  Rhythm: Normal rate and regular rhythm.     Heart sounds: No murmur heard. Pulmonary:     Effort: Pulmonary effort is normal. No respiratory distress.     Breath sounds: Normal breath sounds.  Abdominal:     Palpations: Abdomen is soft.     Tenderness: There is no abdominal tenderness.  Musculoskeletal:        General: No swelling.     Cervical back: Neck supple.  Skin:    General: Skin is warm and dry.     Capillary Refill: Capillary refill takes less than 2 seconds.  Neurological:     Mental Status: She is alert.  Psychiatric:        Mood and Affect: Mood normal.      ED Results / Procedures / Treatments   Labs (all labs ordered are listed, but only abnormal results are displayed) Labs Reviewed  RESP PANEL BY RT-PCR (RSV, FLU A&B, COVID)  RVPGX2 - Abnormal; Notable for the following components:      Result Value   Influenza B by PCR POSITIVE (*)    All other components within normal limits  GROUP A STREP BY PCR    EKG None  Radiology No results found.  Procedures Procedures    Medications Ordered in ED Medications  ibuprofen (ADVIL) tablet 800 mg (800 mg Oral Given 03/15/22 1320)  oxymetazoline (AFRIN) 0.05 % nasal spray 2 spray (2 sprays Each Nare Given 03/15/22 1320)    ED Course/ Medical Decision Making/ A&P                             Medical Decision Making Risk OTC drugs. Prescription drug management.   2:10 PM 42yo female presenting for fevers, cough, sore throat, nasal congestion, and body aches x 2 days.  Is alert oriented x 3, temperature  of 100.1 F, mild your tachycardia at 102 bpm.  Patient is nontoxic-appearing.  No signs or symptoms of sepsis.  No signs or symptoms of nuchal rigidity or neck stiffness.  Doubt meningitis.  PCR positive for influenza.  Patient given Tamiflu.  Given Motrin 800 mg and Afrin for symptomatic control.  Evaluation demonstrates improvement of symptoms.  Patient in no distress and overall condition improved here in the ED. Detailed discussions were had with the patient regarding current findings, and need for close f/u with PCP or on call doctor. The patient has been instructed to return immediately if the symptoms worsen in any way for re-evaluation. Patient verbalized understanding and is in agreement with current care plan. All questions answered prior to discharge.         Final Clinical Impression(s) / ED Diagnoses Final diagnoses:  Influenza    Rx / DC Orders ED Discharge Orders          Ordered    oseltamivir (TAMIFLU) 75 MG capsule  Every 12 hours        03/15/22  1409              Lianne Cure, DO 63/78/58 1410

## 2022-03-15 NOTE — ED Triage Notes (Signed)
Patient presents with complaints of subjective fever, sore throat that started Friday night. Taken ibuprofen with no relief. Denies shortness of breath, N/V/D

## 2022-03-15 NOTE — Discharge Instructions (Addendum)
Today you are diagnosed with influenza.  Please rest, drink lots of water, vitamin C supplements or eat lots of fruit, and take your prescription that I sent to the pharmacy for Tamiflu which is an antiviral medication.  Motrin or Tylenol every 6-8 hours for fevers or bodyaches.

## 2022-03-23 NOTE — Progress Notes (Signed)
I spoke with Shelby Paul, 03/20/22, patient was coughing `the entire call, patient said that she was diagnosed with the Flu on 03/15/22, Influenza B.  I told patient to rest and take medications for the flu and that I would call back on Monday.  03/23/22 I called Shelby Paul, she was coughing a lot, patient reports that it is productive, "white sputum". Shelby Paul also reported that she is weak. I told Shelby. Paul that she needs to recovery from the flu before she has surgery, patient agreed. I told [name] that I would contact Dr. Elly Modena and the office will be in contact with her to reschedule.

## 2022-03-24 ENCOUNTER — Ambulatory Visit (HOSPITAL_COMMUNITY)
Admission: RE | Admit: 2022-03-24 | Payer: Medicaid Other | Source: Home / Self Care | Admitting: Obstetrics and Gynecology

## 2022-03-24 SURGERY — SALPINGECTOMY, BILATERAL, LAPAROSCOPIC
Anesthesia: Choice | Laterality: Bilateral

## 2022-06-08 ENCOUNTER — Other Ambulatory Visit: Payer: Self-pay

## 2022-06-08 ENCOUNTER — Encounter (HOSPITAL_COMMUNITY): Payer: Self-pay | Admitting: Obstetrics and Gynecology

## 2022-06-08 NOTE — H&P (Signed)
Shelby Paul is an 42 y.o. female P6 here for scheduled sterilization procedure. Patient is doing well and is without complaints. Patient reports AUB with depo-provera. She is not interested in trying different forms of contraception. Patient desires permanent sterilization.  Pertinent Gynecological History: Last mammogram: previously ordered  Last pap: normal Date: 09/2021   Menstrual History:  Patient's last menstrual period was 04/29/2022 (approximate).    Past Medical History:  Diagnosis Date   Gallstones 06/2011   Gestational diabetes    Pancreatitis 06/2011   Thrombocytopenia (HCC) 07/11/2021    Past Surgical History:  Procedure Laterality Date   CESAREAN SECTION  12/02/2019   Procedure: CESAREAN SECTION;  Surgeon: Reva Bores, MD;  Location: [facility] LD ORS;  Service: Obstetrics;;   CESAREAN SECTION N/A 07/10/2021   Procedure: CESAREAN SECTION;  Surgeon: Myna Hidalgo, DO;  Location: [facility] LD ORS;  Service: Obstetrics;  Laterality: N/A;    Family History  Problem Relation Age of Onset   Diabetes Mother    Hypertension Mother    Diabetes Father     Social History:  reports that she has never smoked. She has never used smokeless tobacco. She reports that she does not drink alcohol and does not use drugs.  Allergies: No Known Allergies  No medications prior to admission.    Review of Systems See pertinent in HPI. All other systems reviewed and non contributory Blood pressure 129/85, pulse 65, temperature 98.6 F (37 C), temperature source Oral, resp. rate 17, height 5\' 4"  (1.626 m), weight 90.7 kg, last menstrual period 04/29/2022, SpO2 100 %, currently breastfeeding. Physical Exam GENERAL: Well-developed, well-nourished female in no acute distress.  LUNGS: Clear to auscultation bilaterally.  HEART: Regular rate and rhythm. ABDOMEN: Soft, nontender, nondistended. No organomegaly. PELVIC: Deferred to OR EXTREMITIES: No cyanosis, clubbing, or edema, 2+ distal  pulses.  Results for orders placed or performed during the hospital encounter of 06/09/22 (from the past 24 hour(s))  Glucose, capillary     Status: Abnormal   Collection Time: 06/09/22  8:41 AM  Result Value Ref Range   Glucose-Capillary 100 (H) 70 - 99 mg/dL  CBC     Status: Abnormal   Collection Time: 06/09/22  9:06 AM  Result Value Ref Range   WBC 4.5 4.0 - 10.5 K/uL   RBC 4.07 3.87 - 5.11 MIL/uL   Hemoglobin 11.5 (L) 12.0 - 15.0 g/dL   HCT 16.1 (L) 09.6 - 04.5 %   MCV 86.7 80.0 - 100.0 fL   MCH 28.3 26.0 - 34.0 pg   MCHC 32.6 30.0 - 36.0 g/dL   RDW 40.9 81.1 - 91.4 %   Platelets 169 150 - 400 K/uL   nRBC 0.0 0.0 - 0.2 %  Type and screen [facility]     Status: None   Collection Time: 06/09/22  9:09 AM  Result Value Ref Range   ABO/RH(D) B POS    Antibody Screen NEG    Sample Expiration      06/12/2022,2359 Performed at [facility], [address], [place], [place] [address]   Pregnancy, urine POC     Status: Abnormal   Collection Time: 06/09/22  9:46 AM  Result Value Ref Range   Preg Test, Ur POSITIVE (A) NEGATIVE    No results found.  Assessment/Plan: 42 yo P6 who desires permanent sterilization here for scheduled surgery - Patient with positive pregnancy test prior to procedure - Patient plans to review her options - Procedure will  be scheduled at a later date    Tregan Read 06/09/2022, 1:19 PM

## 2022-06-08 NOTE — Progress Notes (Signed)
SDW call  Patient was given pre-op instructions over the phone. Patient verbalized understanding of instructions provided.     PCP - Denies  PPM/ICD - Denies   Chest x-ray - n/a EKG -  DOS, 06/09/2022 Stress Test - ECHO -  Cardiac Cath -   Sleep Study/sleep apnea/CPAP: Denies  Gestational diabetes   Blood Thinner Instructions: Denies Aspirin Instructions:Denies   ERAS Protcol - No, NPO PRE-SURGERY Ensure or G2-    COVID TEST- n/a   Anesthesia review: No   Patient denies shortness of breath, fever, cough and chest pain over the phone call   Your procedure is scheduled on Tuesday 06/09/2022  Report to [facility] Main Entrance "A" at 0800 A.M., then check in with the Admitting office.  Call this number if you have problems the morning of surgery:  [phone]   If you have any questions prior to your surgery date call [phone]: Open Monday-Friday 8am-4pm If you experience any cold or flu symptoms such as cough, fever, chills, shortness of breath, etc. between now and your scheduled surgery, please notify us at the above number     Remember:  Do not eat or drink  after midnight the night before your surgery  Take these medicines the morning of surgery with A SIP OF WATER:  Norethindrone  As of today, STOP taking any Aspirin (unless otherwise instructed by your surgeon) Aleve, Naproxen, Ibuprofen, Motrin, Advil, Goody's, BC's, all herbal medications, fish oil, and all vitamins.

## 2022-06-09 ENCOUNTER — Other Ambulatory Visit: Payer: Self-pay

## 2022-06-09 ENCOUNTER — Ambulatory Visit (HOSPITAL_COMMUNITY)
Admission: RE | Admit: 2022-06-09 | Discharge: 2022-06-09 | Disposition: A | Discharge status: Home / Self Care | Payer: Medicaid Other | Attending: Obstetrics and Gynecology | Admitting: Obstetrics and Gynecology

## 2022-06-09 ENCOUNTER — Encounter (HOSPITAL_COMMUNITY): Payer: Self-pay | Admitting: Obstetrics and Gynecology

## 2022-06-09 ENCOUNTER — Encounter (HOSPITAL_COMMUNITY)
Admission: RE | Disposition: A | Discharge status: Home / Self Care | Payer: Self-pay | Source: Home / Self Care | Attending: Obstetrics and Gynecology

## 2022-06-09 ENCOUNTER — Ambulatory Visit (HOSPITAL_COMMUNITY): Payer: Medicaid Other | Admitting: Certified Registered Nurse Anesthetist

## 2022-06-09 DIAGNOSIS — Z01818 Encounter for other preprocedural examination: Secondary | ICD-10-CM | POA: Insufficient documentation

## 2022-06-09 DIAGNOSIS — Z01812 Encounter for preprocedural laboratory examination: Secondary | ICD-10-CM

## 2022-06-09 DIAGNOSIS — Z538 Procedure and treatment not carried out for other reasons: Secondary | ICD-10-CM | POA: Diagnosis not present

## 2022-06-09 LAB — TYPE AND SCREEN
ABO/RH(D): B POS
Antibody Screen: NEGATIVE

## 2022-06-09 LAB — CBC
HCT: 35.3 % — ABNORMAL LOW (ref 36.0–46.0)
Hemoglobin: 11.5 g/dL — ABNORMAL LOW (ref 12.0–15.0)
MCH: 28.3 pg (ref 26.0–34.0)
MCHC: 32.6 g/dL (ref 30.0–36.0)
MCV: 86.7 fL (ref 80.0–100.0)
Platelets: 169 10*3/uL (ref 150–400)
RBC: 4.07 MIL/uL (ref 3.87–5.11)
RDW: 12.1 % (ref 11.5–15.5)
WBC: 4.5 10*3/uL (ref 4.0–10.5)
nRBC: 0 % (ref 0.0–0.2)

## 2022-06-09 LAB — GLUCOSE, CAPILLARY: Glucose-Capillary: 100 mg/dL — ABNORMAL HIGH (ref 70–99)

## 2022-06-09 LAB — POCT PREGNANCY, URINE: Preg Test, Ur: POSITIVE — AB

## 2022-06-09 SURGERY — SALPINGECTOMY, BILATERAL, LAPAROSCOPIC
Anesthesia: General | Laterality: Bilateral

## 2022-06-09 MED ORDER — ROCURONIUM BROMIDE 10 MG/ML (PF) SYRINGE
PREFILLED_SYRINGE | INTRAVENOUS | Status: AC
  Filled 2022-06-09: qty 10

## 2022-06-09 MED ORDER — POVIDONE-IODINE 10 % EX SWAB: 2.0000 | Freq: Once | CUTANEOUS | Status: DC

## 2022-06-09 MED ORDER — CHLORHEXIDINE GLUCONATE 0.12 % MT SOLN
15.0000 mL | Freq: Once | OROMUCOSAL | Status: DC
  Filled 2022-06-09: qty 15

## 2022-06-09 MED ORDER — INSULIN ASPART 100 UNIT/ML IJ SOLN: 0.0000 [IU] | INTRAMUSCULAR | Status: DC | PRN

## 2022-06-09 MED ORDER — LIDOCAINE 2% (20 MG/ML) 5 ML SYRINGE
INTRAMUSCULAR | Status: AC
  Filled 2022-06-09: qty 5

## 2022-06-09 MED ORDER — DEXAMETHASONE SODIUM PHOSPHATE 10 MG/ML IJ SOLN
INTRAMUSCULAR | Status: AC
  Filled 2022-06-09: qty 1

## 2022-06-09 MED ORDER — FENTANYL CITRATE (PF) 250 MCG/5ML IJ SOLN
INTRAMUSCULAR | Status: AC
  Filled 2022-06-09: qty 5

## 2022-06-09 MED ORDER — PROPOFOL 10 MG/ML IV BOLUS
INTRAVENOUS | Status: AC
  Filled 2022-06-09: qty 20

## 2022-06-09 MED ORDER — ONDANSETRON HCL 4 MG/2ML IJ SOLN
INTRAMUSCULAR | Status: AC
  Filled 2022-06-09: qty 2

## 2022-06-09 MED ORDER — MIDAZOLAM HCL 2 MG/2ML IJ SOLN
INTRAMUSCULAR | Status: AC
  Filled 2022-06-09: qty 2

## 2022-06-09 MED ORDER — LACTATED RINGERS IV SOLN: INTRAVENOUS | Status: DC

## 2022-06-09 MED ORDER — ORAL CARE MOUTH RINSE: 15.0000 mL | Freq: Once | OROMUCOSAL | Status: DC

## 2022-06-09 NOTE — Progress Notes (Signed)
Surgery cancelled, urine pregnancy test positive. Dr. Jolayne Panther in to talk to pt. . Pt. Discharged home with family member. IV catheter removed, catheter tip intact.

## 2022-06-09 NOTE — Anesthesia Preprocedure Evaluation (Signed)
Anesthesia Evaluation  Patient identified by MRN, date of birth, ID band Patient awake    Reviewed: Allergy & Precautions, H&P , NPO status , Patient's Chart, lab work & pertinent test results  Airway Mallampati: II  TM Distance: >3 FB Neck ROM: Full    Dental no notable dental hx.    Pulmonary neg pulmonary ROS   Pulmonary exam normal breath sounds clear to auscultation       Cardiovascular negative cardio ROS Normal cardiovascular exam Rhythm:Regular Rate:Normal     Neuro/Psych negative neurological ROS  negative psych ROS   GI/Hepatic negative GI ROS, Neg liver ROS,,,  Endo/Other    Renal/GU negative Renal ROS  negative genitourinary   Musculoskeletal negative musculoskeletal ROS (+)    Abdominal   Peds negative pediatric ROS (+)  Hematology negative hematology ROS (+)   Anesthesia Other Findings   Reproductive/Obstetrics negative OB ROS                             Anesthesia Physical Anesthesia Plan  ASA: 2  Anesthesia Plan: General   Post-op Pain Management: Minimal or no pain anticipated   Induction: Intravenous  PONV Risk Score and Plan: 3 and Ondansetron, Dexamethasone, Treatment may vary due to age or medical condition and Midazolam  Airway Management Planned: Oral ETT  Additional Equipment:   Intra-op Plan:   Post-operative Plan: Extubation in OR  Informed Consent: I have reviewed the patients History and Physical, chart, labs and discussed the procedure including the risks, benefits and alternatives for the proposed anesthesia with the patient or authorized representative who has indicated his/her understanding and acceptance.     Dental advisory given  Plan Discussed with: CRNA and Surgeon  Anesthesia Plan Comments:        Anesthesia Quick Evaluation

## 2022-08-28 IMAGING — US US ABDOMEN LIMITED
1 series · 14 of 25 positions shown · non-contrast
Comparison: None.

CLINICAL DATA: Right upper quadrant abdominal pain.

EXAM:
ULTRASOUND ABDOMEN LIMITED RIGHT UPPER QUADRANT

[Series 1: us abdomen limited ruq (liver/gb) · 14 of 37 slices shown]
[im 1/37]
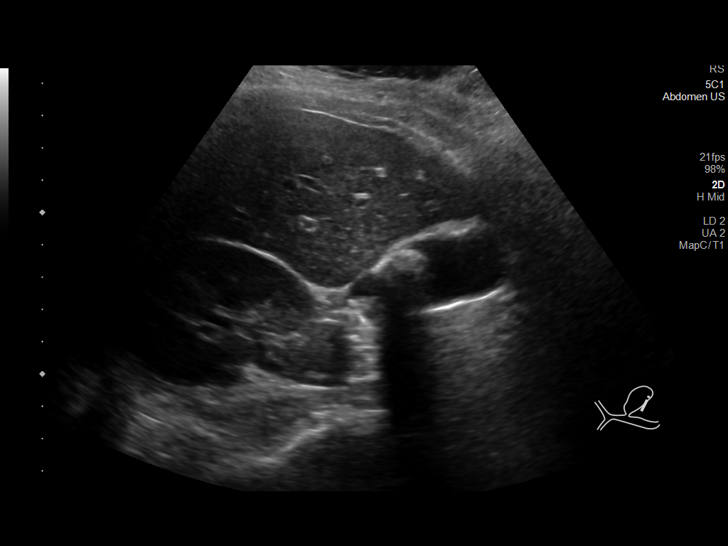
[im 4/37]
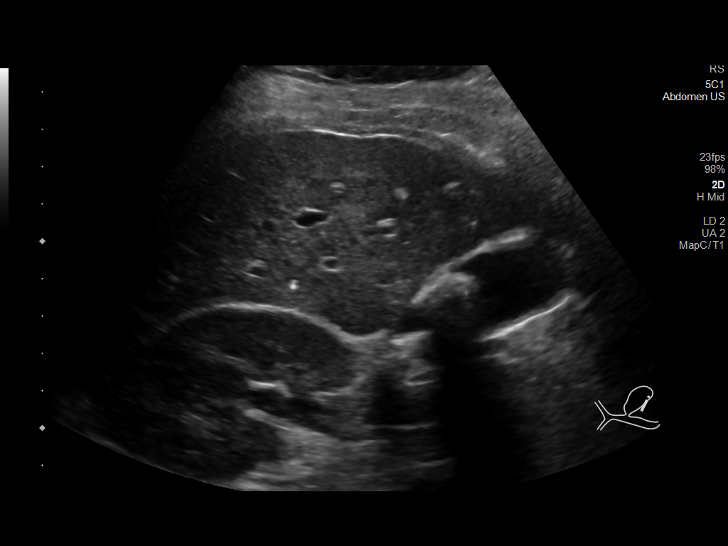
[im 7/37]
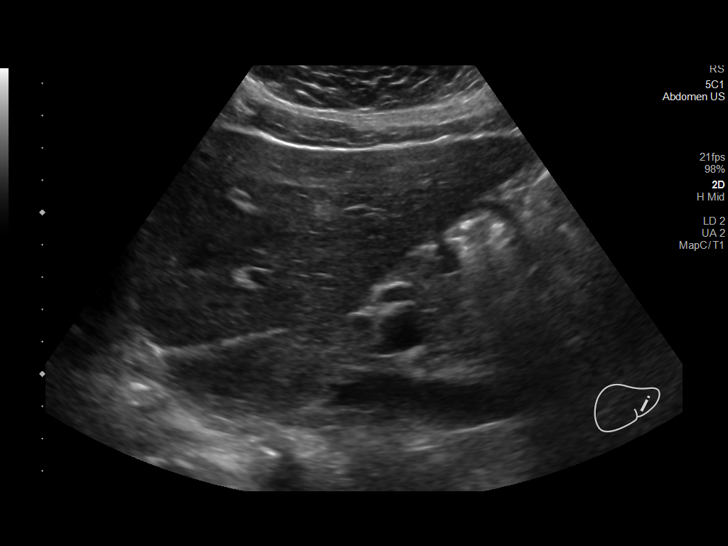
[im 10/37]
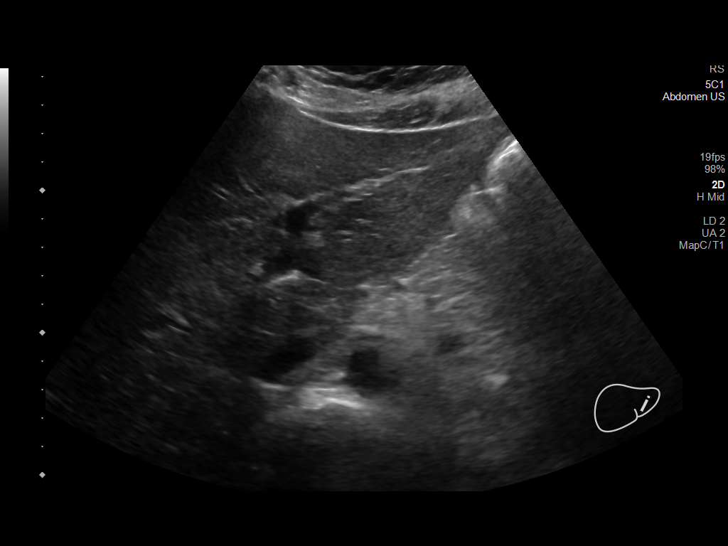
[im 13/37]
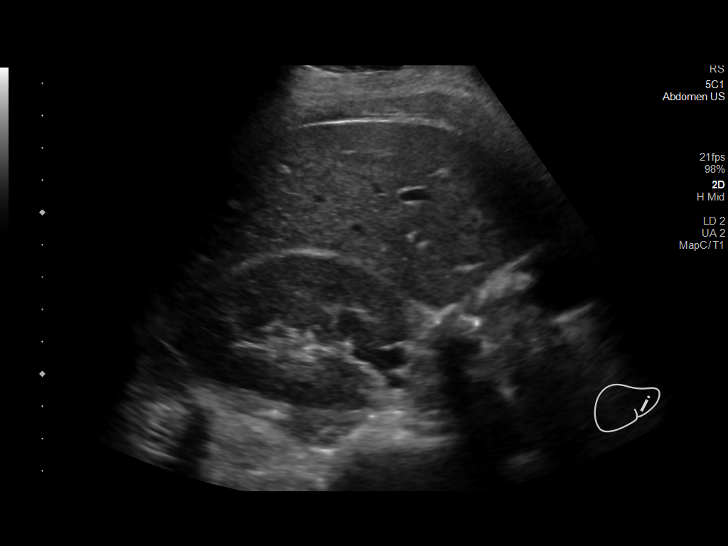
[im 14/37]
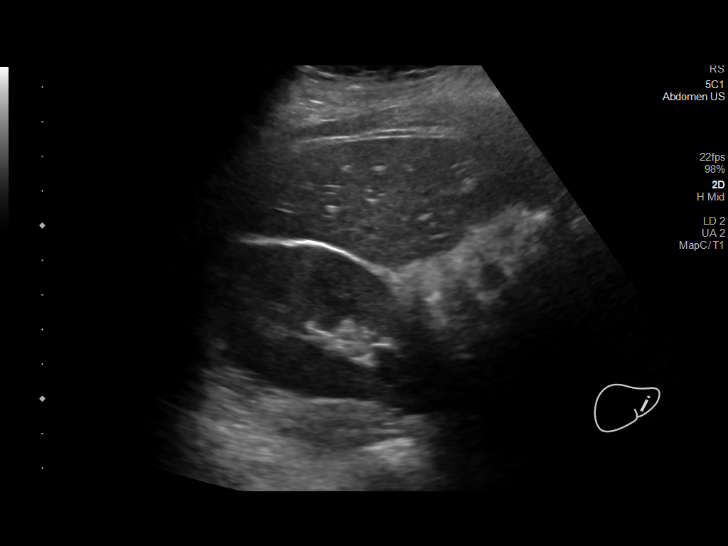
[im 17/37]
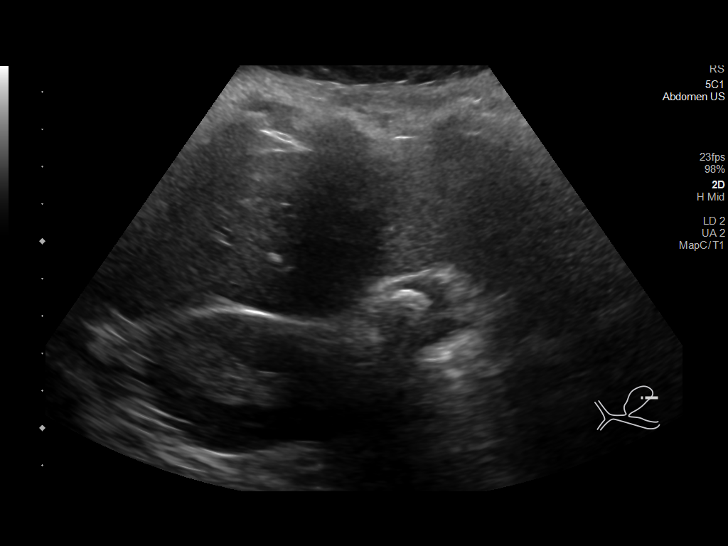
[im 20/37]
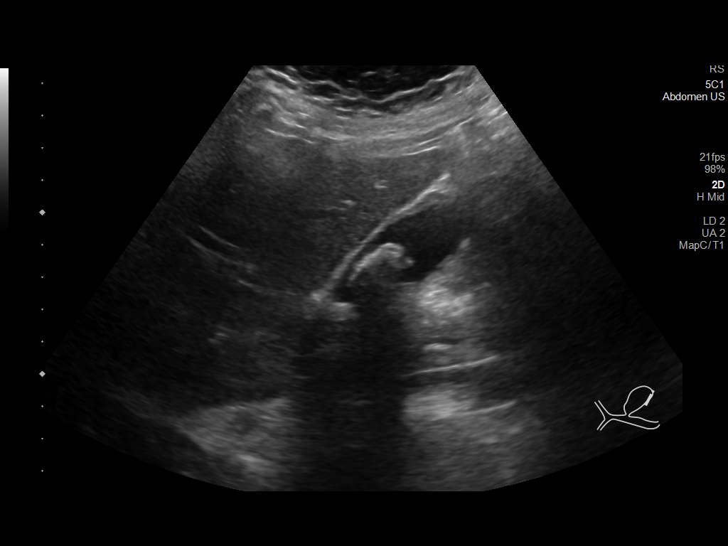
[im 23/37]
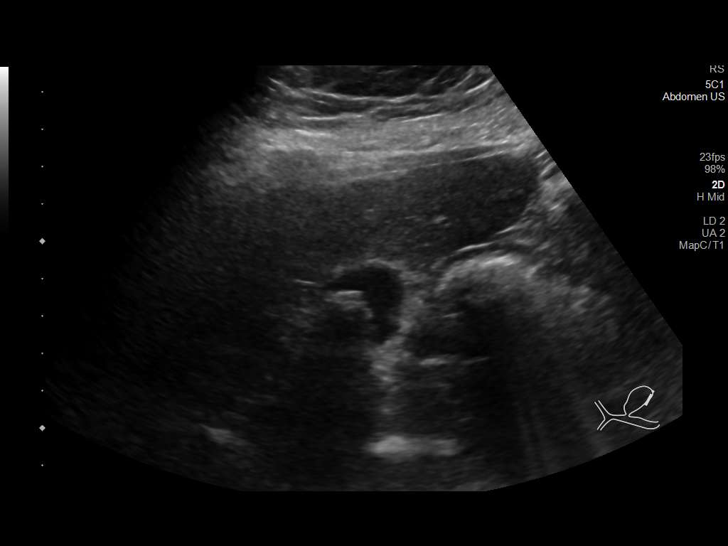
[im 25/37]
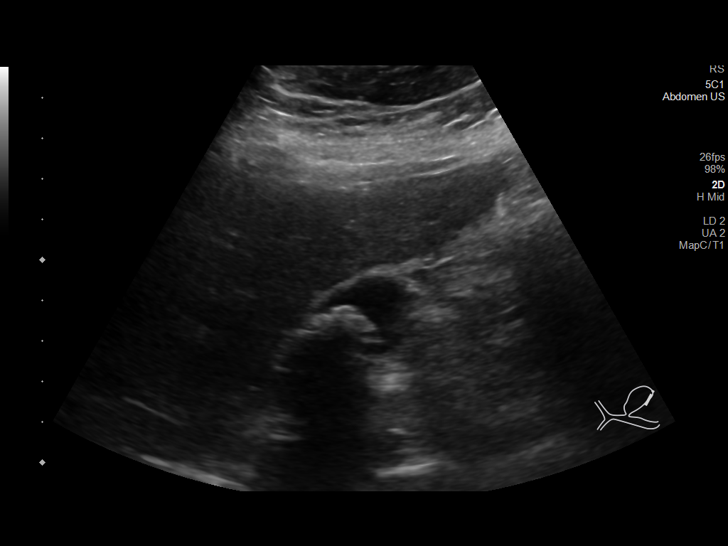
[im 28/37]
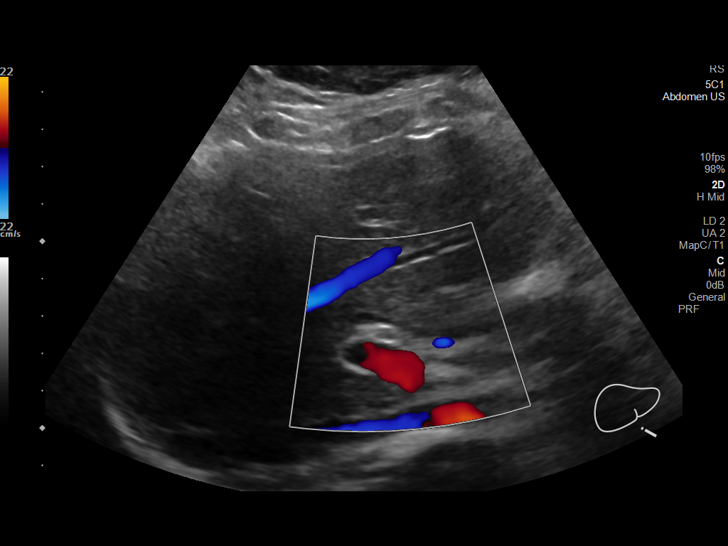
[im 31/37]
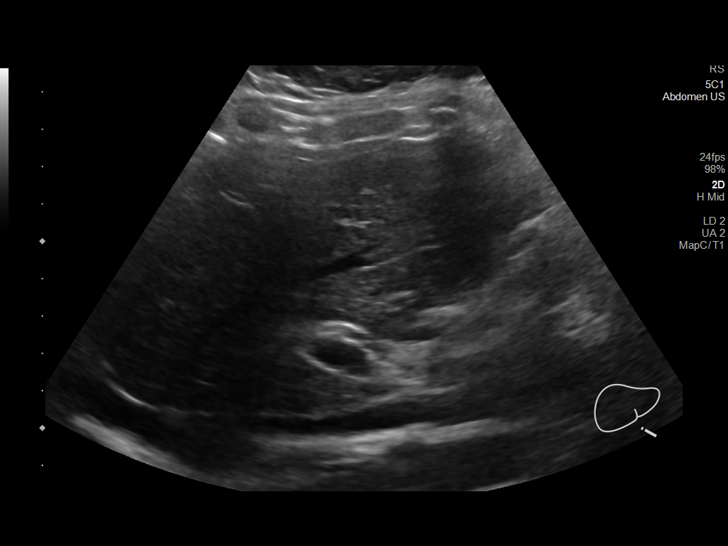
[im 34/37]
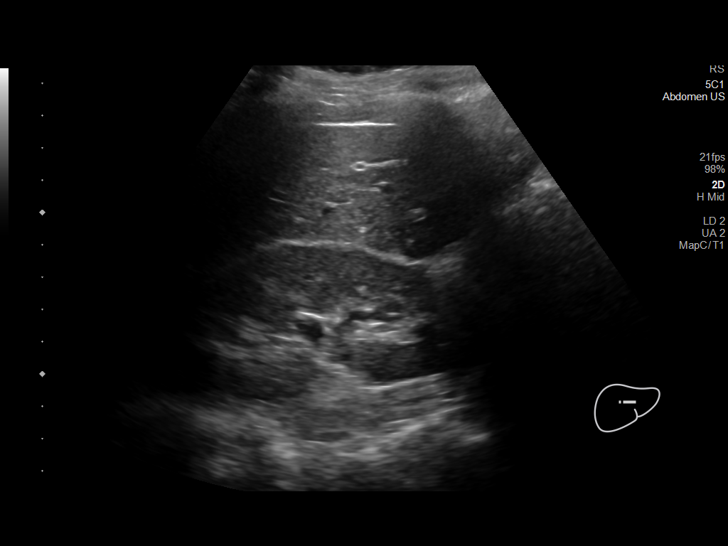
[im 37/37]
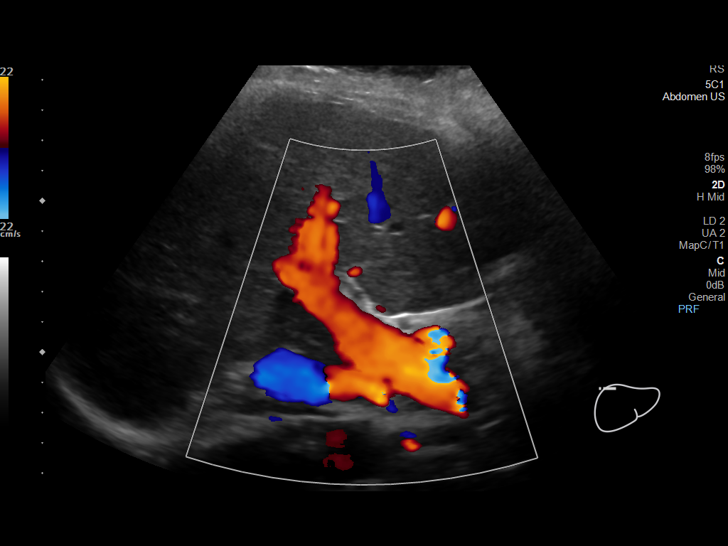

[14 of 25 positions shown; findings below may reference images not displayed]

FINDINGS: Gallbladder:

Multiple gallstones. No gallbladder wall thickening or
pericholecystic fluid. Negative sonographic Murphy's sign.

Common bile duct:

Diameter: 3 mm

Liver:

No focal lesion identified. Within normal limits in parenchymal
echogenicity. Portal vein is patent on color Doppler imaging with
normal direction of blood flow towards the liver.

Other: None.
IMPRESSION: Cholelithiasis without sonographic evidence of acute cholecystitis.

## 2022-12-21 ENCOUNTER — Ambulatory Visit: Payer: Medicaid Other | Admitting: Obstetrics and Gynecology

## 2023-12-07 ENCOUNTER — Other Ambulatory Visit: Payer: Self-pay

## 2023-12-07 DIAGNOSIS — Z1231 Encounter for screening mammogram for malignant neoplasm of breast: Secondary | ICD-10-CM

## 2023-12-22 ENCOUNTER — Ambulatory Visit: Admitting: Physician Assistant

## 2023-12-28 ENCOUNTER — Ambulatory Visit
Admission: RE | Admit: 2023-12-28 | Discharge: 2023-12-28 | Disposition: A | Discharge status: Home / Self Care | Source: Ambulatory Visit

## 2023-12-28 DIAGNOSIS — Z1231 Encounter for screening mammogram for malignant neoplasm of breast: Secondary | ICD-10-CM
